# Patient Record
Sex: Female | Born: 1965 | Race: Black or African American | Hispanic: No | Marital: Single | Smoking: Never smoker
Health system: Southern US, Community
[De-identification: ages and names within clinical notes are randomized; demographics above are authoritative.]

## PROBLEM LIST (undated history)

## (undated) DIAGNOSIS — F419 Anxiety disorder, unspecified: Secondary | ICD-10-CM

## (undated) DIAGNOSIS — D649 Anemia, unspecified: Secondary | ICD-10-CM

## (undated) DIAGNOSIS — J45909 Unspecified asthma, uncomplicated: Secondary | ICD-10-CM

## (undated) DIAGNOSIS — Z9289 Personal history of other medical treatment: Secondary | ICD-10-CM

## (undated) HISTORY — PX: TUBAL LIGATION: SHX77

## (undated) HISTORY — PX: OTHER SURGICAL HISTORY: SHX169

---

## 1997-04-20 ENCOUNTER — Ambulatory Visit (HOSPITAL_COMMUNITY): Admission: RE | Admit: 1997-04-20 | Discharge: 1997-04-20 | Payer: Self-pay | Admitting: Internal Medicine

## 1997-04-26 ENCOUNTER — Other Ambulatory Visit: Admission: RE | Admit: 1997-04-26 | Discharge: 1997-04-26 | Payer: Self-pay | Admitting: Obstetrics & Gynecology

## 1997-06-13 ENCOUNTER — Ambulatory Visit: Admission: RE | Admit: 1997-06-13 | Discharge: 1997-06-13 | Payer: Self-pay | Admitting: Internal Medicine

## 1997-06-21 ENCOUNTER — Emergency Department (HOSPITAL_COMMUNITY): Admission: EM | Admit: 1997-06-21 | Discharge: 1997-06-21 | Payer: Self-pay | Admitting: Emergency Medicine

## 1997-07-26 ENCOUNTER — Ambulatory Visit (HOSPITAL_BASED_OUTPATIENT_CLINIC_OR_DEPARTMENT_OTHER): Admission: RE | Admit: 1997-07-26 | Discharge: 1997-07-26 | Payer: Self-pay | Admitting: Orthopedic Surgery

## 1997-08-08 ENCOUNTER — Encounter: Admission: RE | Admit: 1997-08-08 | Discharge: 1997-11-06 | Payer: Self-pay | Admitting: Orthopedic Surgery

## 1998-06-06 ENCOUNTER — Emergency Department (HOSPITAL_COMMUNITY): Admission: EM | Admit: 1998-06-06 | Discharge: 1998-06-06 | Payer: Self-pay | Admitting: Emergency Medicine

## 1999-04-06 ENCOUNTER — Ambulatory Visit (HOSPITAL_COMMUNITY): Admission: RE | Admit: 1999-04-06 | Discharge: 1999-04-06 | Payer: Self-pay | Admitting: Internal Medicine

## 1999-04-06 ENCOUNTER — Encounter: Admission: RE | Admit: 1999-04-06 | Discharge: 1999-04-06 | Payer: Self-pay | Admitting: Internal Medicine

## 1999-04-06 ENCOUNTER — Encounter: Payer: Self-pay | Admitting: Internal Medicine

## 1999-07-16 ENCOUNTER — Ambulatory Visit (HOSPITAL_COMMUNITY): Admission: RE | Admit: 1999-07-16 | Discharge: 1999-07-16 | Payer: Self-pay | Admitting: Gastroenterology

## 1999-07-16 ENCOUNTER — Encounter: Payer: Self-pay | Admitting: Gastroenterology

## 1999-09-25 ENCOUNTER — Other Ambulatory Visit: Admission: RE | Admit: 1999-09-25 | Discharge: 1999-09-25 | Payer: Self-pay | Admitting: Obstetrics and Gynecology

## 2004-12-29 ENCOUNTER — Emergency Department (HOSPITAL_COMMUNITY): Admission: EM | Admit: 2004-12-29 | Discharge: 2004-12-29 | Payer: Self-pay | Admitting: Emergency Medicine

## 2007-12-29 ENCOUNTER — Emergency Department (HOSPITAL_COMMUNITY): Admission: EM | Admit: 2007-12-29 | Discharge: 2007-12-29 | Payer: Self-pay | Admitting: Family Medicine

## 2008-06-29 ENCOUNTER — Emergency Department (HOSPITAL_COMMUNITY): Admission: EM | Admit: 2008-06-29 | Discharge: 2008-06-29 | Payer: Self-pay | Admitting: Emergency Medicine

## 2008-10-19 ENCOUNTER — Encounter: Payer: Self-pay | Admitting: Physician Assistant

## 2009-01-09 ENCOUNTER — Ambulatory Visit: Payer: Self-pay | Admitting: Family Medicine

## 2009-01-09 ENCOUNTER — Encounter: Payer: Self-pay | Admitting: Physician Assistant

## 2009-01-09 DIAGNOSIS — D509 Iron deficiency anemia, unspecified: Secondary | ICD-10-CM | POA: Insufficient documentation

## 2009-01-09 DIAGNOSIS — R143 Flatulence: Secondary | ICD-10-CM

## 2009-01-09 DIAGNOSIS — R142 Eructation: Secondary | ICD-10-CM

## 2009-01-09 DIAGNOSIS — J45909 Unspecified asthma, uncomplicated: Secondary | ICD-10-CM | POA: Insufficient documentation

## 2009-01-09 DIAGNOSIS — N920 Excessive and frequent menstruation with regular cycle: Secondary | ICD-10-CM

## 2009-01-09 DIAGNOSIS — R141 Gas pain: Secondary | ICD-10-CM | POA: Insufficient documentation

## 2009-01-09 DIAGNOSIS — R002 Palpitations: Secondary | ICD-10-CM | POA: Insufficient documentation

## 2009-01-09 LAB — CONVERTED CEMR LAB
ALT: 10 units/L (ref 0–35)
AST: 14 units/L (ref 0–37)
Albumin: 4.3 g/dL (ref 3.5–5.2)
Alkaline Phosphatase: 64 units/L (ref 39–117)
BUN: 6 mg/dL (ref 6–23)
CO2: 23 meq/L (ref 19–32)
Calcium: 9.2 mg/dL (ref 8.4–10.5)
Chloride: 103 meq/L (ref 96–112)
Cholesterol: 164 mg/dL (ref 0–200)
Creatinine, Ser: 0.74 mg/dL (ref 0.40–1.20)
Ferritin: 10 ng/mL (ref 10–291)
Glucose, Bld: 80 mg/dL (ref 70–99)
HCT: 35.5 % — ABNORMAL LOW (ref 36.0–46.0)
HDL: 60 mg/dL (ref >39)
Helicobacter Pylori Antibody-IgG: <0.4
Hemoglobin: 11.8 g/dL — ABNORMAL LOW (ref 12.0–15.0)
Hgb A1c MFr Bld: 5.8 % (ref 4.6–6.1)
Iron: 20 ug/dL — ABNORMAL LOW (ref 42–145)
LDL Cholesterol: 88 mg/dL (ref 0–99)
MCHC: 33.2 g/dL (ref 30.0–36.0)
MCV: 78 fL (ref 78.0–100.0)
Platelets: 460 10*3/uL — ABNORMAL HIGH (ref 150–400)
Potassium: 4.6 meq/L (ref 3.5–5.3)
RBC: 4.55 M/uL (ref 3.87–5.11)
RDW: 13.8 % (ref 11.5–15.5)
Sodium: 140 meq/L (ref 135–145)
TSH: 1.191 microintl units/mL (ref 0.350–4.500)
Total Bilirubin: 0.3 mg/dL (ref 0.3–1.2)
Total CHOL/HDL Ratio: 2.7
Total Protein: 7.4 g/dL (ref 6.0–8.3)
Triglycerides: 80 mg/dL (ref <150)
VLDL: 16 mg/dL (ref 0–40)
Vit D, 25-Hydroxy: 11 ng/mL — ABNORMAL LOW (ref 30–89)
WBC: 9 10*3/uL (ref 4.0–10.5)

## 2009-01-10 ENCOUNTER — Encounter (INDEPENDENT_AMBULATORY_CARE_PROVIDER_SITE_OTHER): Payer: Self-pay | Admitting: Family Medicine

## 2009-01-11 ENCOUNTER — Ambulatory Visit: Payer: Self-pay | Admitting: Family Medicine

## 2009-01-12 ENCOUNTER — Encounter (INDEPENDENT_AMBULATORY_CARE_PROVIDER_SITE_OTHER): Payer: Self-pay | Admitting: Family Medicine

## 2009-01-12 LAB — CONVERTED CEMR LAB
Collection Interval-CRCL: 24 hr
Creatinine 24 HR UR: 1887 mg/24hr — ABNORMAL HIGH (ref 700–1800)
Creatinine, Urine: 188.7 mg/dL
Protein, Ur: 30 mg/24hr — ABNORMAL LOW (ref 50–100)

## 2009-01-16 ENCOUNTER — Encounter: Payer: Self-pay | Admitting: Physician Assistant

## 2009-01-16 ENCOUNTER — Ambulatory Visit (HOSPITAL_COMMUNITY): Admission: RE | Admit: 2009-01-16 | Discharge: 2009-01-16 | Payer: Self-pay | Admitting: Family Medicine

## 2009-01-16 ENCOUNTER — Encounter (INDEPENDENT_AMBULATORY_CARE_PROVIDER_SITE_OTHER): Payer: Self-pay | Admitting: Family Medicine

## 2009-01-16 DIAGNOSIS — D259 Leiomyoma of uterus, unspecified: Secondary | ICD-10-CM | POA: Insufficient documentation

## 2009-02-09 ENCOUNTER — Ambulatory Visit: Payer: Self-pay | Admitting: Physician Assistant

## 2009-02-09 DIAGNOSIS — K047 Periapical abscess without sinus: Secondary | ICD-10-CM | POA: Insufficient documentation

## 2009-02-09 DIAGNOSIS — E559 Vitamin D deficiency, unspecified: Secondary | ICD-10-CM | POA: Insufficient documentation

## 2009-02-16 ENCOUNTER — Ambulatory Visit: Payer: Self-pay | Admitting: Physician Assistant

## 2009-02-17 ENCOUNTER — Encounter: Payer: Self-pay | Admitting: Physician Assistant

## 2009-02-17 LAB — CONVERTED CEMR LAB: Metanephrines, Ur: 100 (ref 58–203)

## 2009-02-20 ENCOUNTER — Encounter: Payer: Self-pay | Admitting: Physician Assistant

## 2009-02-21 ENCOUNTER — Encounter: Payer: Self-pay | Admitting: Physician Assistant

## 2009-03-09 ENCOUNTER — Telehealth: Payer: Self-pay | Admitting: Physician Assistant

## 2009-03-10 ENCOUNTER — Ambulatory Visit: Payer: Self-pay | Admitting: Obstetrics and Gynecology

## 2009-03-10 ENCOUNTER — Other Ambulatory Visit: Admission: RE | Admit: 2009-03-10 | Discharge: 2009-03-10 | Payer: Self-pay | Admitting: Obstetrics and Gynecology

## 2009-03-28 ENCOUNTER — Ambulatory Visit: Payer: Self-pay | Admitting: Physician Assistant

## 2009-03-28 DIAGNOSIS — Z8719 Personal history of other diseases of the digestive system: Secondary | ICD-10-CM

## 2009-03-28 DIAGNOSIS — K5649 Other impaction of intestine: Secondary | ICD-10-CM

## 2009-03-28 LAB — CONVERTED CEMR LAB: OCCULT 1: NEGATIVE

## 2009-03-30 ENCOUNTER — Encounter: Payer: Self-pay | Admitting: Physician Assistant

## 2009-03-30 LAB — CONVERTED CEMR LAB
BUN: 9 mg/dL (ref 6–23)
CO2: 22 meq/L (ref 19–32)
Chloride: 106 meq/L (ref 96–112)
Creatinine, Ser: 0.74 mg/dL (ref 0.40–1.20)
Eosinophils Absolute: 0.8 10*3/uL — ABNORMAL HIGH (ref 0.0–0.7)
Eosinophils Relative: 8 % — ABNORMAL HIGH (ref 0–5)
Glucose, Bld: 82 mg/dL (ref 70–99)
HCT: 35 % — ABNORMAL LOW (ref 36.0–46.0)
Hemoglobin: 9.9 g/dL — ABNORMAL LOW (ref 12.0–15.0)
Lymphocytes Relative: 24 % (ref 12–46)
Lymphs Abs: 2.4 10*3/uL (ref 0.7–4.0)
MCV: 74.9 fL — ABNORMAL LOW (ref 78.0–100.0)
Monocytes Absolute: 1 10*3/uL (ref 0.1–1.0)
Platelets: 443 10*3/uL — ABNORMAL HIGH (ref 150–400)
Potassium: 5 meq/L (ref 3.5–5.3)
WBC: 10.4 10*3/uL (ref 4.0–10.5)

## 2009-03-31 ENCOUNTER — Encounter: Payer: Self-pay | Admitting: Physician Assistant

## 2009-03-31 ENCOUNTER — Ambulatory Visit: Payer: Self-pay | Admitting: Obstetrics and Gynecology

## 2009-04-03 ENCOUNTER — Ambulatory Visit: Payer: Self-pay | Admitting: Physician Assistant

## 2009-04-03 DIAGNOSIS — E538 Deficiency of other specified B group vitamins: Secondary | ICD-10-CM | POA: Insufficient documentation

## 2009-04-04 LAB — CONVERTED CEMR LAB
Basophils Absolute: 0.1 10*3/uL (ref 0.0–0.1)
Basophils Relative: 1 % (ref 0–1)
Eosinophils Absolute: 0.7 10*3/uL (ref 0.0–0.7)
MCHC: 27.9 g/dL — ABNORMAL LOW (ref 30.0–36.0)
MCV: 73.5 fL — ABNORMAL LOW (ref 78.0–100.0)
Monocytes Relative: 8 % (ref 3–12)
Neutro Abs: 5.9 10*3/uL (ref 1.7–7.7)
Neutrophils Relative %: 59 % (ref 43–77)
Platelets: 448 10*3/uL — ABNORMAL HIGH (ref 150–400)
RDW: 16.2 % — ABNORMAL HIGH (ref 11.5–15.5)

## 2009-04-07 ENCOUNTER — Encounter: Payer: Self-pay | Admitting: Physician Assistant

## 2009-04-10 ENCOUNTER — Ambulatory Visit: Payer: Self-pay | Admitting: Physician Assistant

## 2009-04-14 ENCOUNTER — Ambulatory Visit: Payer: Self-pay | Admitting: Physician Assistant

## 2009-04-18 ENCOUNTER — Telehealth: Payer: Self-pay | Admitting: Physician Assistant

## 2009-04-27 ENCOUNTER — Encounter (INDEPENDENT_AMBULATORY_CARE_PROVIDER_SITE_OTHER): Payer: Self-pay | Admitting: Cardiology

## 2009-04-27 ENCOUNTER — Ambulatory Visit (HOSPITAL_COMMUNITY): Admission: RE | Admit: 2009-04-27 | Discharge: 2009-04-27 | Payer: Self-pay | Admitting: Cardiology

## 2009-05-01 ENCOUNTER — Ambulatory Visit: Payer: Self-pay | Admitting: Physician Assistant

## 2009-05-01 DIAGNOSIS — M25569 Pain in unspecified knee: Secondary | ICD-10-CM

## 2009-05-15 ENCOUNTER — Ambulatory Visit: Payer: Self-pay | Admitting: Physician Assistant

## 2009-05-23 ENCOUNTER — Ambulatory Visit (HOSPITAL_COMMUNITY): Admission: RE | Admit: 2009-05-23 | Discharge: 2009-05-23 | Payer: Self-pay | Admitting: Gastroenterology

## 2009-06-14 ENCOUNTER — Ambulatory Visit: Payer: Self-pay | Admitting: Physician Assistant

## 2009-07-20 ENCOUNTER — Ambulatory Visit: Payer: Self-pay | Admitting: Obstetrics & Gynecology

## 2009-07-24 ENCOUNTER — Ambulatory Visit: Payer: Self-pay | Admitting: Physician Assistant

## 2009-08-23 ENCOUNTER — Ambulatory Visit: Payer: Self-pay | Admitting: Physician Assistant

## 2009-08-23 LAB — CONVERTED CEMR LAB
Basophils Absolute: 0.1 10*3/uL (ref 0.0–0.1)
Basophils Relative: 1 % (ref 0–1)
Eosinophils Absolute: 0.8 10*3/uL — ABNORMAL HIGH (ref 0.0–0.7)
MCHC: 26.4 g/dL — ABNORMAL LOW (ref 30.0–36.0)
MCV: 62.8 fL — ABNORMAL LOW (ref 78.0–100.0)
Monocytes Relative: 9 % (ref 3–12)
Neutrophils Relative %: 62 % (ref 43–77)
RBC: 4.46 M/uL (ref 3.87–5.11)
RDW: 20.4 % — ABNORMAL HIGH (ref 11.5–15.5)

## 2009-08-24 ENCOUNTER — Telehealth: Payer: Self-pay | Admitting: Physician Assistant

## 2009-08-25 ENCOUNTER — Encounter: Payer: Self-pay | Admitting: Physician Assistant

## 2009-08-27 ENCOUNTER — Encounter: Payer: Self-pay | Admitting: Physician Assistant

## 2009-08-28 ENCOUNTER — Ambulatory Visit: Payer: Self-pay | Admitting: Physician Assistant

## 2009-08-28 LAB — CONVERTED CEMR LAB
Basophils Absolute: 0.1 10*3/uL (ref 0.0–0.1)
Basophils Relative: 1 % (ref 0–1)
Ferritin: 3 ng/mL — ABNORMAL LOW (ref 10–291)
MCHC: 26.9 g/dL — ABNORMAL LOW (ref 30.0–36.0)
Neutro Abs: 7.3 10*3/uL (ref 1.7–7.7)
Neutrophils Relative %: 58 % (ref 43–77)
RBC Folate: 746 ng/mL — ABNORMAL HIGH (ref 180–600)
RBC: 4.71 M/uL (ref 3.87–5.11)
RDW: 20.4 % — ABNORMAL HIGH (ref 11.5–15.5)

## 2009-09-05 ENCOUNTER — Encounter: Payer: Self-pay | Admitting: Physician Assistant

## 2009-09-08 ENCOUNTER — Telehealth: Payer: Self-pay | Admitting: Physician Assistant

## 2009-09-28 ENCOUNTER — Ambulatory Visit: Payer: Self-pay | Admitting: Physician Assistant

## 2009-09-29 LAB — CONVERTED CEMR LAB
Basophils Absolute: 0 10*3/uL (ref 0.0–0.1)
Lymphocytes Relative: 24 % (ref 12–46)
Neutro Abs: 6.6 10*3/uL (ref 1.7–7.7)
Neutrophils Relative %: 63 % (ref 43–77)
Platelets: 453 10*3/uL — ABNORMAL HIGH (ref 150–400)
RDW: 21.9 % — ABNORMAL HIGH (ref 11.5–15.5)

## 2009-10-30 ENCOUNTER — Ambulatory Visit: Payer: Self-pay | Admitting: Physician Assistant

## 2009-11-30 ENCOUNTER — Ambulatory Visit: Payer: Self-pay | Admitting: Internal Medicine

## 2009-12-01 ENCOUNTER — Ambulatory Visit: Payer: Self-pay | Admitting: Internal Medicine

## 2010-02-27 NOTE — Progress Notes (Signed)
Summary: GI referral update -  Appt 4/42011  Phone Note From Other Clinic   Caller: Referral Coordinator Summary of Call: Pt aware of appt on 05-01-09 @ 9:15am with Dr.Ganem @ [facility] Initial call taken by: Candi Leash,  April 18, 2009 10:57 AM

## 2010-02-27 NOTE — Letter (Signed)
Summary: *HSN Results Follow up  [facility]  [address] [place], [place] [address]   Phone: [phone]  Fax: [phone]      02/20/2009   [facility] [place] [address] [place], [place]  [address]   Dear  Ms. Haley Weber,                            ____S.Drinkard,FNP   ____D. Gore,FNP       ____B. McPherson,MD   ____V. Rankins,MD    ____E. Mulberry,MD    ____N. Daphine Deutscher, FNP  ____D. Reche Dixon, MD    ____K. Philipp Deputy, MD    __x__S. Alben Spittle, PA-C     This letter is to inform you that your recent test(s):  _______Pap Smear    _______Lab Test     _______X-ray    _______ is within acceptable limits  _______ requires a medication change  _______ requires a follow-up lab visit  _______ requires a follow-up visit with your provider   Comments: 24 hour urine test was completely normal.       _________________________________________________________ If you have any questions, please contact our office                     Sincerely,  Tereso Newcomer PA-C [facility]

## 2010-02-27 NOTE — Letter (Signed)
Summary: *Referral Letter  [facility]  [address] [place], [place] [address]   Phone: [phone]  Fax: [phone]    03/30/2009  Thank you in advance for agreeing to see my patient:  Haley Weber [address] [place], [place]  [address]  Phone: [phone]  Reason for Referral: 45 yo female with h/o menorrhagia and iron deficiency anemia for which she has seen gynecology.  However, she had palpitations recently and epigastric pain followed by "black" stool.  She describes the stool as "pudding."  Her recent Hgb is 9.9.  It was 11.8 in December.  Her MCV is low.  She had heme negative stool in the office.  BUN was normal on recent blood work.  Please evaluate.  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 4)  VENTOLIN HFA 108 (90 BASE) MCG/ACT AERS (ALBUTEROL SULFATE) 1-2 puffs every 4-6 hours as needed 5)  METOPROLOL TARTRATE 25 MG TABS (METOPROLOL TARTRATE) Take 1 by mouth as needed for palpitations.  Past Medical History: 1)  Anemia-iron deficiency 2)  Asthma  Pertinent Labs:  As above   Thank you again for agreeing to see our patient; please contact us if you have any further questions or need additional information.  Sincerely,  Tereso Newcomer PA-C

## 2010-02-27 NOTE — Progress Notes (Signed)
  Phone Note From Other Clinic   Summary of Call: Jasmine December, from the [facility] called in today because they need the provider notes, labs, last visit or anything regarding that states why she was referring  there.  It seems that the pt is bleeding a lot..  [phone] office [phone].  The pt suppostly needs to be there by tomorrow but she needs everything before tomorrow.  [employer]. Initial call taken by: Manon Hilding,  March 09, 2009 9:45 AM  Follow-up for Phone Call        spoke with rececption and pt appt was reschedule upon pt .Marland Kitchen... faxing notes today Follow-up by: Arm[place] Shannon,  March 13, 2009 9:34 AM

## 2010-02-27 NOTE — Assessment & Plan Note (Signed)
Summary: NEED APPROVAL FOR CRYO THERAPY//KT   Vital Signs:  Patient profile:   45 year old female Menstrual status:  regular LMP:     11/13/2009 Weight:      269.5 pounds BSA:     2.16 Temp:     98 degrees F oral Pulse rate:   80 / minute Pulse rhythm:   regular Resp:     18 per minute BP sitting:   130 / 80  (left arm) Cuff size:   large  Vitals Entered By: Gaylyn Cheers RN (December 01, 2009 3:04 PM) CC: needs referral for cryo for fibroid  GYN clinic Is Patient Diabetic? No Pain Assessment Patient in pain? no       Does patient need assistance? Functional Status Self care Ambulation Normal Comments Pt. takes only Iron, VitD, Calcium, Vit B-12, Ventolin no other medications LMP (date): 11/13/2009     Menstrual Status regular Enter LMP: 11/13/2009   Primary Care Provider:  Tereso Newcomer PA-C  CC:  needs referral for cryo for fibroid  GYN clinic.  History of Present Illness: 1.  Uterine fibroids:  [facility]--was told to have me call [phone] and ask to have cryosurgery for her fibroids rather than endometrial ablation so that she may have a better shot at maintaining fertility.  Called number--fax number.  Pt. states she has heavy bleeding with her periods--for which she was evaluated at [facility].  Later, able to find last progress note from [facility]:  was not offered crysosurgery--Pt. was vacillating between IUD and myomectomy, endometrial ablation.  She later cancelled surgery for the latter.  When discussed with pt, she states she was told by the receptionist at [facility] and NOT [facility] that she could get the cryosurgery if I sent a request to the clinic.  The [phone] number above is actually fax number for [facility]  Allergies (verified): No Known Drug Allergies  Physical Exam  General:  NAD   Impression & Recommendations:  Problem # 1:  FIBROIDS, UTERUS (ICD-218.9) Menorrhagia--will [facility] as they are closed for  the weekend and discuss. Discussed with pt. that her ability to get pregnant at her current age is likely limited and she needs to discuss with gyne the likelihood of pregnancy if she gets the cryosurgery.  Not clear that they actually offer this treatment based on note and my experience in referring to them.  Complete Medication List: 1)  Pepcid 20 Mg Tabs (Famotidine) .... Take 1 tablet by mouth two times a day 2)  Nu-iron 150 Mg Caps (Polysaccharide iron complex) .... Take 1 tablet by mouth two times a day 3)  Ergocalciferol 50000 Unit Caps (Ergocalciferol) .Marland Kitchen.. 1 by mouth every week for 12 weeks. 4)  Ventolin Hfa 108 (90 Base) Mcg/act Aers (Albuterol sulfate) .Marland Kitchen.. 1-2 puffs every 4-6 hours as needed 5)  Metoprolol Tartrate 25 Mg Tabs (Metoprolol tartrate) .... Take 1 by mouth as needed for palpitations.   Orders Added: 1)  Est. Patient Level III [86578]   Not Administered:    Influenza Vaccine not given due to: declined

## 2010-02-27 NOTE — Miscellaneous (Signed)
Summary: Records from prior PCP reviewed   Reviewed records from prior PCP. Note she used to get B12 injections from prior PCP. Ask her when she last received B12 injection.   Left message on answering machine for pt to call back...Marland KitchenMarland KitchenArmenia Shannon  April 04, 2009 9:08 AM   pt says she recieved her last shot last year with her previous provider around Sept. or Oct..Marland KitchenJaslin Novitski says her previous provider did send her bill and she thinks she has to pay this for [place] to get her records... pt says she did not owe them any money she thinks its the price to retrieve records... pt says she doesn't have this at the time but she wanted you aware .Marland Kitchen [place] Shannon  April 06, 2009 4:51 PM   Have her come in for labs: B12 and folate. Then start B12 injections q month. Make sure she is taking her iron. Tereso Newcomer, [place] 3.10.2011  15:08   Left message on answering machine for pt to call back...Marland KitchenMarland KitchenArmenia Shannon  April 07, 2009 4:25 PM  spoke with pt and she said she doesn't have the money to pick up meds and has not turned in information to pharmacy yet....  and she has appt to come in for labs.. [place] Shannon  April 07, 2009 4:56 PM    Clinical Lists Changes  Problems: Added new problem of B12 DEFICIENCY (ICD-266.2) - Signed Assessed B12 DEFICIENCY as comment only - start b12 injections check b12 and folate - Signed Observations: Added new observation of PAST MED HX: Anemia-iron deficiency Asthma Obesity Paresthesias Palpitations Fatigue Menorrhagia B12 deficiency Vitamin D Deficiency Pap 04/2008:  normal except + Trich Mammo 09/2008:  normal  (04/03/2009 20:35)       Impression & Recommendations:  Problem # 1:  B12 DEFICIENCY (ICD-266.2) start b12 injections check b12 and folate  Complete Medication List: 1)  Pepcid 20 Mg Tabs (Famotidine) .... Take 1 tablet by mouth two times a day 2)  Nu-iron 150 Mg Caps (Polysaccharide iron complex) .... Take 1 tablet by mouth two times  a day 3)  Ergocalciferol 50000 Unit Caps (Ergocalciferol) .Marland Kitchen.. 1 by mouth every week for 12 weeks. 4)  Ventolin Hfa 108 (90 Base) Mcg/act Aers (Albuterol sulfate) .Marland Kitchen.. 1-2 puffs every 4-6 hours as needed 5)  Metoprolol Tartrate 25 Mg Tabs (Metoprolol tartrate) .... Take 1 by mouth as needed for palpitations.   Past History:  Past Medical History: Anemia-iron deficiency Asthma Obesity Paresthesias Palpitations Fatigue Menorrhagia B12 deficiency Vitamin D Deficiency Pap 04/2008:  normal except + Trich Mammo 09/2008:  normal

## 2010-02-27 NOTE — Assessment & Plan Note (Signed)
Summary: 1 MONTH FU///KT   Vital Signs:  Patient profile:   45 year old female Height:      61.5 inches Weight:      262 pounds BMI:     48.88 O2 Sat:      96 % on Room air Temp:     97.9 degrees F oral Pulse rate:   88 / minute Pulse rhythm:   regular Resp:     18 per minute BP sitting:   124 / 85  (left arm) Cuff size:   regular  Vitals Entered By: [place] Shannon (February 09, 2009 8:29 AM)  O2 Flow:  Room air CC: pt says she still have problems when she goes to sleep...Marland Kitchen pt says her left is numb and her heart races as she trys to sleep...Marland Kitchen pt says it feels as if she has a helmet on and she is tired...Marland KitchenMarland Kitchen pt wants a referral to denist.... Is Patient Diabetic? No Pain Assessment Patient in pain? no       Does patient need assistance? Functional Status Self care Ambulation Normal Comments pf 1.   345     2.   405    3.  350   Primary Care Provider:  Carolyne Fiscal  CC:  pt says she still have problems when she goes to sleep...Marland Kitchen pt says her left is numb and her heart races as she trys to sleep...Marland Kitchen pt says it feels as if she has a helmet on and she is tired...Marland KitchenMarland Kitchen pt wants a referral to denist.....  History of Present Illness: Here for f/u. Originally saw Dr. Carolyne Fiscal one month ago.  Now following up with me. Had myriad of tests run for anemia, menorrhagia and palp's.  Palp's:  Pt eval at [facility] in 09/2008 for palp's.  First started in Sep.  Worse at night.  Describes tachypalp's.  Feels near syncopal and nauseated.  Wakes her up at times.  Says her Echo was normal at [facility].  Also, wore a monitor.  Sounds like a 24 hour holter.  Says she had "tachycardia."  Was scheduled to have a stress test.  Has chest discomfort with palp's.  Feels heavy/pressure like.  Feels short of breath with palp's.  No exertional chest pain or shortness of breath.  No syncope.  No orthopnea.  Slight edema in lower extremities.  Has feeling of tingling in arms with palps.  Occurs once or twice a week.  Not getting worse.   Seems to be getting better.  No caffeine, OTC cold meds, diet pills, drugs, alcohol, cigs.  Says she brought records with her last time and Echo demonstrated normal LVF; overall normal echo.  Labs ordered last time included TSH which was normal.  Urine to r/o pheo ordered but wrong test run (creat. clearance).  Anemia:  Patient has fibroid uterus.  Is iron deficient with just min. low Hgb.  GYN referral pending.  Abd bloating:  Never tried omeprazole.  Also c/o dental pain.  Has broken tooth on top left.  Pain worse over last few days.  No fever.  Face feels swollen.  Current Medications (verified): 1)  Omeprazole 20 Mg Cpdr (Omeprazole) .Marland Kitchen.. 1 By Mouth Daily For 4 Weeks 2)  Iron 325 (65 Fe) Mg Tabs (Ferrous Sulfate) .Marland Kitchen.. 1 By Mouth Two Times A Day, Not With Omeprazole  Allergies (verified): No Known Drug Allergies  Past History:  Past Medical History: Last updated: 01/09/2009 Anemia-iron deficiency Asthma  Family History: Reviewed history from 01/09/2009 and no changes  required. Father deceased at age 86 from prostate cancer Mother alive with schizzophrenia, alzheimers, HTN Siblings with obesity, HTN, hyperthyroidism (?), depression Children well  Social History: Reviewed history from 01/09/2009 and no changes required. 5 sisters, one brother 1 daughter 72 1 son 84 Occupation: Part time with postal service  Lives with her adult children Single Never Smoked Alcohol use-no Drug use-no Regular exercise-no  Physical Exam  General:  alert, well-developed, and well-nourished.   Head:  normocephalic and atraumatic.   Eyes:  pupils equal, pupils round, and pupils reactive to light.   Mouth:  teeth missing and gingival inflammation.   upper molar on left mostly missing with surrounding erythema Neck:  supple.   Lungs:  normal breath sounds, no crackles, and no wheezes.   Heart:  normal rate and regular rhythm.   Abdomen:  soft.   Neurologic:  alert & oriented X3 and  cranial nerves II-XII intact.   Psych:  normally interactive.     Impression & Recommendations:  Problem # 1:  PALPITATIONS, RECURRENT (ICD-785.1)  echo normal unknown holter results TSH normal patient denies stress as a cause does have flushing and diaph. and reports h/o BP going up; will go ahead and reorder 24 hour urine to r/o pheo. check ECG today refer to cardiology  Orders: T-Urine 24 Hr. Catecholamines [phone]) T-Urine 24hr. VMA/HVA ([id number]) T-Urine 24 Hr. Metanephrines [id number]) Cardiology Referral (Cardiology)  Problem # 2:  ANEMIA-IRON DEFICIENCY (ICD-280.9)  likely related to fibroids check stool cards change to nuiron to prevent constipation  Her updated medication list for this problem includes:    Nu-iron 150 Mg Caps (Polysaccharide iron complex) .Marland Kitchen... Take 1 tablet by mouth two times a day  Orders: Hemoccult Cards -3 specimans (take home) ([id number])  Problem # 3:  ASTHMA (ICD-493.90) mild symptoms  Problem # 4:  ABDOMINAL BLOATING (ICD-787.3) CMET normal last check H pylori neg patient never tried PPI encouraged her to try  Problem # 5:  ABSCESS, TOOTH (ICD-522.5)  refert to dental tx with antibiotics  Orders: Dental Referral (Dentist)  Problem # 6:  VITAMIN D DEFICIENCY (ICD-268.9) start replacement  Complete Medication List: 1)  Omeprazole 20 Mg Cpdr (Omeprazole) .Marland Kitchen.. 1 by mouth daily for 4 weeks 2)  Nu-iron 150 Mg Caps (Polysaccharide iron complex) .... Take 1 tablet by mouth two times a day 3)  Amoxicillin 500 Mg Caps (Amoxicillin) .... Take 1 tablet by mouth three times a day 4)  Ergocalciferol 50000 Unit Caps (Ergocalciferol) .Marland Kitchen.. 1 by mouth every week for 12 weeks.  Patient Instructions: 1)  Take 650 - 1000 mg of tylenol every 4-6 hours as needed for relief of pain or comfort of fever. Avoid taking more than 4000 mg in a 24 hour period( can cause liver damage in higher doses).   2)  OR 3)  Take 400-600 mg of  Ibuprofen (Advil, Motrin) with food every 4-6 hours as needed  for relief of pain or comfort of fever.  4)  Please schedule a follow-up appointment in 6  weeks with Rosamaria Donn for palpitations.  5)  Sign release to get records from cardiologist at [facility]. Prescriptions: ERGOCALCIFEROL 50000 UNIT CAPS (ERGOCALCIFEROL) 1 by mouth every week for 12 weeks.  #12 x 0   Entered and Authorized by:   Tereso Newcomer PA-C   Signed by:   Tereso Newcomer PA-C on 02/09/2009   Method used:   Print then Give to Patient   RxID:   [id number] AMOXICILLIN 500 MG CAPS (AMOXICILLIN) Take  1 tablet by mouth three times a day  #21 x 0   Entered and Authorized by:   Tereso Newcomer PA-C   Signed by:   Tereso Newcomer PA-C on 02/09/2009   Method used:   Print then Give to Patient   RxID:   [id number] OMEPRAZOLE 20 MG CPDR (OMEPRAZOLE) 1 by mouth daily for 4 weeks  #30 x 1   Entered and Authorized by:   Tereso Newcomer PA-C   Signed by:   Tereso Newcomer PA-C on 02/09/2009   Method used:   Reprint   RxID:   [id number] NU-IRON 150 MG CAPS (POLYSACCHARIDE IRON COMPLEX) Take 1 tablet by mouth two times a day  #60 x 6   Entered and Authorized by:   Tereso Newcomer PA-C   Signed by:   Tereso Newcomer PA-C on 02/09/2009   Method used:   Print then Give to Patient   RxID:   [id number]    Pelvic US  Procedure date:  01/16/2009  Findings:      IMPRESSION:    1.  Approximate 2.5 cm submucosal fibroid in the uterine fundus   causing mass effect upon the endometrial stripe.   2.  No endometrial fluid or mass.   3.  Normal-appearing left ovary.  Right ovary not visualized.  EKG  Procedure date:  02/09/2009  Findings:      NSR HR 73 Normal axis no ischemic changes no preexcitation no Brugada like changes    Appended Document: 1 MONTH FU///KT  Laboratory Results    Stool - Occult Blood Hemmoccult #1: negative Date: 02/17/2009 Hemoccult #2: negative Date: 02/17/2009 Hemoccult #3: negative Date:  02/17/2009

## 2010-02-27 NOTE — Letter (Signed)
Summary: EGD &COLO REPORT & CONSULT NOTES  EGD &COLO REPORT & CONSULT NOTES   Imported By: Arta Bruce 09/28/2009 15:25:55  _____________________________________________________________________  External Attachment:    Type:   Image     Comment:   External Document

## 2010-02-27 NOTE — Progress Notes (Signed)
Summary: anemia  Phone Note Outgoing Call   Summary of Call: She is still very anemic. I reviewed her GI tests. She had a normal EGD and colo.  Make sure she takes the iron. Drink OJ with each dose.  She needs f/u with the Gyn clinic. Please fax her labs to the Gyn clinic. Please talk to the patient and notify the Gyn clinic that I want her to f/u there b/c her anemia is caused by her heavy periods. Please ask for her Chella Chapdelaine at the gyn clinic to call me on my cell phone next week ([phone]).  Repeat a CBC in 2 weeks.  Initial call taken by: Brynda Rim,  September 08, 2009 5:16 PM  Follow-up for Phone Call        Left message on answering machine for pt to call back.Marland KitchenMarland KitchenMarland KitchenArmenia Weber  September 11, 2009 2:37 PM   Left message on answering machine for pt to call back.Marland KitchenMarland KitchenMarland KitchenArmenia Weber  September 12, 2009 12:18 PM   Additional Follow-up for Phone Call Additional follow up Details #1::        Patient is aware of test results. Will follow-up with Gyn.Marland KitchenMarland KitchenMarland KitchenMarland Kitchenpatient lab visit after she has made appt. with Gyn.... Haley Weber  September 12, 2009 2:55 PM

## 2010-02-27 NOTE — Letter (Signed)
Summary: PT INFORMATION SHEET  PT INFORMATION SHEET   Imported By: Arta Bruce 02/24/2009 10:12:52  _____________________________________________________________________  External Attachment:    Type:   Image     Comment:   External Document

## 2010-02-27 NOTE — Assessment & Plan Note (Signed)
Summary: f/u with Scott in 1 month/REPEAT CBC//gk   Vital Signs:  Patient profile:   45 year old female Weight:      267 pounds O2 Sat:      99 % on Room air Temp:     98.5 degrees F Pulse rate:   83 / minute Pulse rhythm:   regular Resp:     18 per minute BP sitting:   119 / 80  (left arm) Cuff size:   large  Vitals Entered By: Chauncy Passy, SMA  O2 Flow:  Room air CC: Pt. is here b/c her left leg is swollen and tender. This has been going on since Saturday. Pt. currently has the holter monitor.  Is Patient Diabetic? No Pain Assessment Patient in pain? no       Does patient need assistance? Functional Status Self care Ambulation Normal Comments PF: [phone]   Primary Care Provider:  Tereso Newcomer PA-C  CC:  Pt. is here b/c her left leg is swollen and tender. This has been going on since Saturday. Pt. currently has the holter monitor. Marland Kitchen  History of Present Illness: Scheduled for f/u today. She saw card.  She had stress test.  BP went up  during stress.  No records yet.  She is wearing heart monitor.  Seeing Dr. Armanda Magic.  Saw GI.  Gets colo in couple weeks.  She is waiting to hear back if she can get the novasure ablation or IUD for her menorrhagia.  She developed some knee pain and swelling on the left 2 days after her ETT.  She notes it is better now.  Felt hot.  NO recent trips.  No injury to leg.  No syncope or chest pain.  Anemia:  Got B12 injection today.  Never got iron rx filled.   Current Medications (verified): 1)  Pepcid 20 Mg Tabs (Famotidine) .... Take 1 Tablet By Mouth Two Times A Day 2)  Nu-Iron 150 Mg Caps (Polysaccharide Iron Complex) .... Take 1 Tablet By Mouth Two Times A Day 3)  Ergocalciferol 50000 Unit Caps (Ergocalciferol) .Marland Kitchen.. 1 By Mouth Every Week For 12 Weeks. 4)  Ventolin Hfa 108 (90 Base) Mcg/act Aers (Albuterol Sulfate) .Marland Kitchen.. 1-2 Puffs Every 4-6 Hours As Needed 5)  Metoprolol Tartrate 25 Mg Tabs (Metoprolol Tartrate) ....  Take 1 By Mouth As Needed For Palpitations.  Allergies (verified): No Known Drug Allergies  Past History:  Past Medical History: Last updated: 04/03/2009 Anemia-iron deficiency Asthma Obesity Paresthesias Palpitations Fatigue Menorrhagia B12 deficiency Vitamin D Deficiency Pap 04/2008:  normal except + Trich Mammo 09/2008:  normal  Physical Exam  General:  alert, well-developed, and well-nourished.   Head:  normocephalic and atraumatic.   Neck:  supple.   Lungs:  normal breath sounds.   Heart:  normal rate and regular rhythm.   Msk:  left knee: no eff full ROM neg lachmans neg McMurray neg Appleys neg ant drawer + tend over insertion of medial hamstring tendon Extremities:  no edema Neurologic:  alert & oriented X3 and cranial nerves II-XII intact.   Psych:  normally interactive.     Impression & Recommendations:  Problem # 1:  KNEE PAIN, LEFT (ICD-719.46) suspect hamstring tendonitis nsaids ice rest elevation f/u as needed  Problem # 2:  B12 DEFICIENCY (ICD-266.2) start injections  Problem # 3:  MELENA, HX OF (ICD-V12.79) seeing GI  Problem # 4:  VITAMIN D DEFICIENCY (ICD-268.9) needs to start Rx  Problem #  5:  MENORRHAGIA (ICD-626.2) f/u with GYN clinic  Problem # 6:  PALPITATIONS, RECURRENT (ICD-785.1) f/u with Card  Her updated medication list for this problem includes:    Metoprolol Tartrate 25 Mg Tabs (Metoprolol tartrate) .Marland Kitchen... Take 1 by mouth as needed for palpitations.  Problem # 7:  ASTHMA (ICD-493.90) stable on rescue inhaler  Her updated medication list for this problem includes:    Ventolin Hfa 108 (90 Base) Mcg/act Aers (Albuterol sulfate) .Marland Kitchen... 1-2 puffs every 4-6 hours as needed  Problem # 8:  ANEMIA-IRON DEFICIENCY (ICD-280.9) needs to get Iron rx check labs in 3 mos  Her updated medication list for this problem includes:    Nu-iron 150 Mg Caps (Polysaccharide iron complex) .Marland Kitchen... Take 1 tablet by mouth two times a  day  Problem # 9:  PREVENTIVE HEALTH CARE (ICD-V70.0) schedule CPP  Complete Medication List: 1)  Pepcid 20 Mg Tabs (Famotidine) .... Take 1 tablet by mouth two times a day 2)  Nu-iron 150 Mg Caps (Polysaccharide iron complex) .... Take 1 tablet by mouth two times a day 3)  Ergocalciferol 50000 Unit Caps (Ergocalciferol) .Marland Kitchen.. 1 by mouth every week for 12 weeks. 4)  Ventolin Hfa 108 (90 Base) Mcg/act Aers (Albuterol sulfate) .Marland Kitchen.. 1-2 puffs every 4-6 hours as needed 5)  Metoprolol Tartrate 25 Mg Tabs (Metoprolol tartrate) .... Take 1 by mouth as needed for palpitations.  Patient Instructions: 1)  Try to get the iron and start as soon as you can. 2)  Schedule lab visit in 3 months (CBC) Dx 280.9, 266.2. 3)  Schedule B12 injection every month. 4)  Ice your knee two times a day for a couple days. 5)  Use ibuprofen for a couple days. 6)  Take 400-600 mg of Ibuprofen (Advil, Motrin) with food every 4-6 hours as needed  for relief of pain or comfort of fever.  7)  Keep left leg elevated as much as possible. 8)  Return if no better or sooner if worse. 9)  Schedule CPP with Lorin Picket in September 2011.  Come fasting for labs (nothing to eat or drink after midnight except water).

## 2010-02-27 NOTE — Miscellaneous (Signed)
  Clinical Lists Changes  Problems: Changed problem from PALPITATIONS, RECURRENT (ICD-785.1) to PALPITATIONS, RECURRENT (ICD-785.1) - a.  event monitor/holter:  NSR, ST, PVC, PAC   b.  ETT: "ok" per card notes   c.  Echo: "ok" per card. notes   d.  Dr. Armanda Magic [facility] Card. Observations: Added new observation of PAST MED HX: Anemia-iron deficiency Asthma Obesity Paresthesias Palpitations   a.  event monitor/holter:  NSR, ST, PVC, PAC 3.2011   b.  ETT: "ok" per card notes   c.  Echo: "ok" per card. notes   d.  Dr. Armanda Magic [facility] Card. Fatigue Menorrhagia B12 deficiency Vitamin D Deficiency Pap 04/2008:  normal except + Trich Mammo 09/2008:  normal  (08/27/2009 13:10)       Past History:  Past Medical History: Anemia-iron deficiency Asthma Obesity Paresthesias Palpitations   a.  event monitor/holter:  NSR, ST, PVC, PAC 3.2011   b.  ETT: "ok" per card notes   c.  Echo: "ok" per card. notes   d.  Dr. Armanda Magic [facility] Card. Fatigue Menorrhagia B12 deficiency Vitamin D Deficiency Pap 04/2008:  normal except + Trich Mammo 09/2008:  normal

## 2010-02-27 NOTE — Letter (Signed)
Summary: REQUESTING RECORDS FROM [facility]  REQUESTING RECORDS FROM [facility]   Imported By: Arta Bruce 03/06/2009 16:06:35  _____________________________________________________________________  External Attachment:    Type:   Image     Comment:   External Document

## 2010-02-27 NOTE — Letter (Signed)
Summary: SLIDING SCALE FEE  SLIDING SCALE FEE   Imported By: Arta Bruce 02/24/2009 10:15:20  _____________________________________________________________________  External Attachment:    Type:   Image     Comment:   External Document

## 2010-02-27 NOTE — Letter (Signed)
Summary: [facility]  echo from [facility]   Imported By: Arta Bruce 04/03/2009 12:30:41  _____________________________________________________________________  External Attachment:    Type:   Image     Comment:   External Document

## 2010-02-27 NOTE — Progress Notes (Signed)
Summary: Anemia  Phone Note Outgoing Call   Summary of Call: Patient hemoglobin much lower now.   I left her a message to call me back ASAP. I need to speak with her when she gets on phone.  Very important.  She needs to do stool cards x 3.  Initial call taken by: Brynda Rim,  August 24, 2009 9:04 AM  Follow-up for Phone Call        tried calling pt but no answer .Marland KitchenMarland KitchenMarland KitchenArmenia Shannon  August 24, 2009 9:41 AM  Pt called back medical assistant was busy so please call her back .Manon Hilding  August 24, 2009 10:39 AM  Additional Follow-up for Phone Call Additional follow up Details #1::        Spoke to patient. She is mildy symptomatic (notes lightheadedness with standing) and fatigue. No palps or syncope. Suggested we transfuse her with PRBCs and she wants to hold off. She states she is taking her iron every day. She has been getting B12 shots every month. She has not f/u with GYN yet. Periods are heavier now and lasting 8-10 days with very heavy bleeding each day. She denies melena or hematochezia or hematemsis.  [place], Bring her in for repeat labs tomorrow or Monday. Labs:  CBC, Ferritin, RBC Folate, B12,  She needs to complete stool cards x3 as soon as possible. Check orthostatic BP and HR when she comes in. I forgot to tell her on the phone . . . please tell her to take her iron with a glass of orange juice or take Vitamin C 500 mg (with each dose of Iron).  This may help absorption.  Please request records from [facility]).  Additional Follow-up by: Tereso Newcomer PA-C,  August 24, 2009 3:12 PM    Additional Follow-up for Phone Call Additional follow up Details #2::    pt is aware of appt....   request records from GI..... requested records from card.... [place] Shannon  August 25, 2009 10:17 AM     EGD  Procedure date:  05/23/2009  Findings:      IMPRESSION:  Normal esophagogastroduodenoscopy.      There was nothing obvious on this exam to  explain anemia.      The patient will follow up with her physician at [facility]. Location: [facility]   Done by Dr. Herbert Moors with [facility].   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 . .transfuse with PRBCs will verify if she is taking iron check stool cards . . .if + get her in for colo   WBC                  [H]  11.8 K/uL                   4.0-10.5   RBC                       4.46 MIL/uL                 3.87-5.11   Hemoglobin           [  L]  7.4 g/dL                    11.9-14.7   Hematocrit           [L]  28.0 %                      36.0-46.0   MCV                  [L]  62.8 fL                     78.0-100.0 ! MCH                       16.6 pg   MCHC                 [L]  26.4 g/dL                   82.9-56.2   RDW                  [H]  20.4 %                      11.5-15.5   Platelet Count       [H]  503 K/uL                    150-400   Granulocyte %             62 %                        43-77   Absolute Gran             7.3 K/uL                    1.7-7.7   Lymph %                   22 %                        12-46   Absolute Lymph            2.6 K/uL                    0.7-4.0   Mono %                    9 %                         3-12   Absolute Mono        [H]  1.1 K/uL                    0.1-1.0   Eos %                [H]  6 %                         0-5   Absolute Eos         [H]  0.8 K/uL                    0.0-0.7   Baso %  1 %                         0-1   Absolute Baso             0.1 K/uL                    0.0-0.1   Smear Review       RESULT: Criteria for review not met  Note: An exclamation mark (!)  indicates a result that was not dispersed into the flowsheet. Document Creation Date: 08/24/2009 12:44 AM   Her updated medication list for this problem includes:    Nu-iron 150 Mg Caps (Polysaccharide iron complex) .Marland Kitchen... 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 She is mildy symptomatic (notes lightheadedness with standing) and fatigue. No palps or syncope. Suggested we transfuse her with PRBCs and she wants to hold off. She states she is taking her iron every day. She has been getting B12 shots every month. She has not f/u with GYN yet. Periods are heavier now and lasting 8-10 days with very heavy bleeding each day. She denies melena or hematochezia or hematemsis.  If stool cards +, will send her to Dr. Doreatha Martin for colo. If she is orthostatic, will try to get her to have txn with PRBCs.    Her updated medication list for this problem includes:    Nu-iron 150 Mg Caps (Polysaccharide iron complex) .Marland Kitchen... Take 1 tablet by mouth two times a day  Complete Medication List: 1)  Pepcid 20 Mg Tabs (Famotidine) .... Take 1 tablet by mouth two times a day 2)  Nu-iron 150 Mg Caps (Polysaccharide iron complex) .... Take 1 tablet by mouth two times a day 3)  Ergocalciferol 50000 Unit Caps (Ergocalciferol) .Marland Kitchen.. 1 by mouth every week for 12 weeks. 4)  Ventolin Hfa 108 (90 Base) Mcg/act Aers (Albuterol sulfate) .Marland Kitchen.. 1-2 puffs every 4-6 hours as needed 5)  Metoprolol Tartrate 25 Mg Tabs (Metoprolol tartrate) .... Take 1 by mouth as needed for palpitations.    Appended Document: Anemia     Allergies: No Known Drug Allergies   Complete Medication List: 1)  Pepcid 20 Mg Tabs (Famotidine) .... Take 1 tablet by mouth two times a day 2)  Nu-iron 150 Mg Caps (Polysaccharide iron complex) .... Take 1 tablet by mouth two times a day 3)  Ergocalciferol 50000 Unit Caps (Ergocalciferol) .Marland Kitchen.. 1 by mouth every week for 12 weeks. 4)   Ventolin Hfa 108 (90 Base) Mcg/act Aers (Albuterol sulfate) .Marland Kitchen.. 1-2 puffs every 4-6 hours as needed 5)  Metoprolol Tartrate 25 Mg Tabs (Metoprolol tartrate) .... Take 1 by mouth as needed for palpitations.   Laboratory Results    Stool - Occult Blood Hemmoccult #1: negative Date: 09/05/2009 Hemoccult #2: negative Date: 09/05/2009 Hemoccult #3: negative Date: 09/05/2009

## 2010-02-27 NOTE — Letter (Signed)
Summary: *HSN Results Follow up  [facility]  [address] [place], [place] [address]   Phone: [phone]  Fax: [phone]      09/05/2009   [facility] [address] [place], [place]  [address]   Dear  Ms. Haley Weber,                            ____S.Drinkard,FNP   ____D. Gore,FNP       ____B. McPherson,MD   ____V. Rankins,MD    ____E. Mulberry,MD    ____N. Daphine Deutscher, FNP  ____D. Reche Dixon, MD    ____K. Philipp Deputy, MD    __x__S. Alben Spittle, PA-C     This letter is to inform you that your recent test(s):  _______Pap Smear    ___x____Lab Test     _______X-ray    ___x__ is within acceptable limits  _______ requires a medication change  _______ requires a follow-up lab visit  _______ requires a follow-up visit with your provider   Comments:  Stool test negative for blood.       _________________________________________________________ If you have any questions, please contact our office                     Sincerely,  Tereso Newcomer PA-C [facility]

## 2010-02-27 NOTE — Letter (Signed)
Summary: *HSN Results Follow up  [facility]  [address] [place], [place] [address]   Phone: [phone]  Fax: [phone]      02/20/2009   [facility] [place] [address] [place], [place]  [address]   Dear  Ms. Shamonica Cale,                            ____S.Drinkard,FNP   ____D. Gore,FNP       ____B. McPherson,MD   ____V. Rankins,MD    ____E. Mulberry,MD    ____N. Daphine Deutscher, FNP  ____D. Reche Dixon, MD    ____K. Philipp Deputy, MD    __x__S. Alben Spittle, PA-C     This letter is to inform you that your recent test(s):  _______Pap Smear    ___x____Lab Test     _______X-ray    __x_____ is within acceptable limits  _______ requires a medication change  _______ requires a follow-up lab visit  _______ requires a follow-up visit with your provider   Comments: Stool cards were negative for blood.       _________________________________________________________ If you have any questions, please contact our office                     Sincerely,  Tereso Newcomer PA-C [facility]

## 2010-02-27 NOTE — Letter (Signed)
Summary: [facility]//CARDIOLOGY NOTES  [facility]//CARDIOLOGY NOTES   Imported By: Arta Bruce 09/28/2009 15:29:34  _____________________________________________________________________  External Attachment:    Type:   Image     Comment:   External Document

## 2010-02-27 NOTE — Letter (Signed)
Summary: [facility] P[facility] RECORDS  [facility] RECORDS   Imported By: Arta Bruce 05/26/2009 15:38:17  _____________________________________________________________________  External Attachment:    Type:   Image     Comment:   External Document

## 2010-02-27 NOTE — Letter (Signed)
Summary: [facility]  [facility]   Imported By: Arta Bruce 03/09/2009 14:46:41  _____________________________________________________________________  External Attachment:    Type:   Image     Comment:   External Document

## 2010-02-27 NOTE — Miscellaneous (Signed)
Summary: EGD and Colo 4.2011  Clinical Lists Changes  Problems: Assessed ANEMIA-IRON DEFICIENCY as comment only - EGD and Colo normal in 4.2011  Her updated medication list for this problem includes:    Nu-iron 150 Mg Caps (Polysaccharide iron complex) .Marland Kitchen... Take 1 tablet by mouth two times a day  Observations: Added new observation of PRIMARY MD: Tereso Newcomer PA-C (08/27/2009 14:20) Added new observation of PAST MED HX: Anemia-iron deficiency Asthma Obesity Paresthesias Palpitations   a.  event monitor/holter:  NSR, ST, PVC, PAC 3.2011   b.  ETT: "ok" per card notes   c.  Echo: "ok" per card. notes   d.  Dr. Armanda Magic [facility] Card. Fatigue Menorrhagia B12 deficiency Vitamin D Deficiency Pap 04/2008:  normal except + Trich Mammo 09/2008:  normal EGD 4.26.2011:  Normal EGD; nothing to explain anemia. Colo 4.26.2011:  Normal to cecum. (08/27/2009 14:20) Added new observation of EGD: Findings: Normal  Location: [facility]  Dr. Herbert Moors (05/23/2009 14:23) Added new observation of COLONRECACT: Repeat colonoscopy in 10 years.   (05/23/2009 14:23) Added new observation of COLONOSCOPY:  Results: Normal. Location:  [facility].  Dr. Herbert Moors [phone]/26/2011 14:23)      Colonoscopy  Procedure date:  05/23/2009  Findings:       Results: Normal. Location:  [facility].  Dr. Herbert Moors  Comments:      Repeat colonoscopy in 10 years.    EGD  Procedure date:  05/23/2009  Findings:      Findings: Normal  Location: [facility]  Dr. Herbert Moors   Impression & Recommendations:  Problem # 1:  ANEMIA-IRON DEFICIENCY (ICD-280.9) EGD and Colo normal in 4.2011  Her updated medication list for this problem includes:    Nu-iron 150 Mg Caps (Polysaccharide iron complex) .Marland Kitchen... Take 1 tablet by mouth two times a day  Complete Medication List: 1)  Pepcid 20 Mg Tabs (Famotidine) .... Take 1 tablet by mouth two times a day 2)   Nu-iron 150 Mg Caps (Polysaccharide iron complex) .... Take 1 tablet by mouth two times a day 3)  Ergocalciferol 50000 Unit Caps (Ergocalciferol) .Marland Kitchen.. 1 by mouth every week for 12 weeks. 4)  Ventolin Hfa 108 (90 Base) Mcg/act Aers (Albuterol sulfate) .Marland Kitchen.. 1-2 puffs every 4-6 hours as needed 5)  Metoprolol Tartrate 25 Mg Tabs (Metoprolol tartrate) .... Take 1 by mouth as needed for palpitations.   Past History:  Past Medical History: Anemia-iron deficiency Asthma Obesity Paresthesias Palpitations   a.  event monitor/holter:  NSR, ST, PVC, PAC 3.2011   b.  ETT: "ok" per card notes   c.  Echo: "ok" per card. notes   d.  Dr. Armanda Magic [facility] Card. Fatigue Menorrhagia B12 deficiency Vitamin D Deficiency Pap 04/2008:  normal except + Trich Mammo 09/2008:  normal EGD 4.26.2011:  Normal EGD; nothing to explain anemia. Colo 4.26.2011:  Normal to cecum.

## 2010-02-27 NOTE — Letter (Signed)
Summary: REFERRAL//[facility]//AAPPT DATE &TIME  REFERRAL//[facility]//AAPPT DATE &TIME   Imported By: Arta Bruce 04/03/2009 12:33:18  _____________________________________________________________________  External Attachment:    Type:   Image     Comment:   External Document

## 2010-02-27 NOTE — Assessment & Plan Note (Signed)
Summary: 6 WEEK FU FOR PALPATATIONS///KT   Vital Signs:  Patient profile:   45 year old female Height:      61.5 inches Weight:      267 pounds BMI:     49.81 O2 Sat:      98 % on Room air Temp:     98.4 degrees F oral Pulse rate:   85 / minute Pulse rhythm:   regular Resp:     20 per minute BP sitting:   134 / 82  (left arm) Cuff size:   large  Vitals Entered By: [place] Shannon (March 28, 2009 8:32 AM)  O2 Flow:  Room air CC: 6 week f/u palpations.... pt says she had another episode which lasted an hour and thirty min. Is Patient Diabetic? No  Does patient need assistance? Functional Status Self care Ambulation Normal   Primary Care Provider:  Carolyne Fiscal  CC:  6 week f/u palpations.... pt says she had another episode which lasted an hour and thirty min.Marland Kitchen  History of Present Illness: Here for f/u.  Palps:  Has not seen card. yet.  As noted previously, she had a normal echo in past.  She had 24 hour urine that was neg for pheo.  She had another episode about 2 weeks ago.  Woke her up in the middle of the night.  She had tachypalps.  + diaphoretic.  She could hear her heartbeat in her ears.  She felt like she had to urinate.  No near syncope.  No syncope.  No chest pain.  She was slightly short of breath.  She had some abdominal discomfort.  She had a bowel movement with this.  It was black.  No hematochezia.  She is supposed to be on PPI, but has not had it filled.  She also noted some left leg discomfort when she got out of bed.  This improved with moving around.  Stools:  She has continued to have dark stools since.  Her stool cards were neg. from the last visit.  She has felt weak and tired.  She does not use a lot of ibuprofen.  No hematemesis.    Menorrhagia:  Saw GYN and had endometrial bx.  THis was neg.  She is to f/u in a couple weeks.  She states she had a pap in 09/2008.  The notes indicate that and IUD may be recommended to help with her menorrhagia.    Asthma History    Initial Asthma Severity Rating:    Age range: 12+ years    Symptoms: 0-2 days/week    Nighttime Awakenings: 0-2/month    Interferes w/ normal activity: no limitations    SABA use (not for EIB): 0-2 days/week    Asthma Severity Assessment: Intermittent  Problems Prior to Update: 1)  Melena, Hx of  (ICD-V12.79) 2)  Other Impaction of Intestine  (ICD-560.39) 3)  Vitamin D Deficiency  (ICD-268.9) 4)  Abscess, Tooth  (ICD-522.5) 5)  Fibroids, Uterus  (ICD-218.9) 6)  Abdominal Bloating  (ICD-787.3) 7)  Menorrhagia  (ICD-626.2) 8)  Preventive Health Care  (ICD-V70.0) 9)  Palpitations, Recurrent  (ICD-785.1) 10)  Asthma  (ICD-493.90) 11)  Anemia-iron Deficiency  (ICD-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    Physical Exam  General:  alert, well-developed, and well-nourished.   Head:  normocephalic and atraumatic.   Neck:  supple and no carotid bruits.   Lungs:  normal breath sounds, no crackles, and no wheezes.   Heart:  normal rate and regular rhythm.   Abdomen:  soft and no hepatomegaly.   very mild epigastric tenderness Rectal:  no external abnormalities, normal sphincter tone, no masses, no tenderness, and external hemorrhoid(s).   Extremities:  no edema  Neurologic:  alert & oriented X3 and cranial nerves II-XII intact.   Psych:  normally interactive.     Impression & Recommendations:  Problem # 1:  PALPITATIONS, RECURRENT (ICD-785.1)  sounds very suspicious for parox AFib will give her a Rx for metoprolol to take as needed she is due to see cardiology next week defer ASA 2/2 #2 problem  Her updated medication list for this problem includes:    Metoprolol Tartrate 25 Mg Tabs (Metoprolol tartrate) .Marland Kitchen... Take 1 by mouth as needed for palpitations.  Problem # 2:  MELENA, HX OF (ICD-V12.79)  by  her description she has iron def anemia that we thought was due to menorrhagia but, her description of her stools is worrisome she is supposed to be on a PPI for abd bloating but could not afford to get med we only carry pepcid . .  . so will give her pepcid for now which is better than nothing advised her she can get her meds and pay later at our pharmacy will check cbc, bmet today refer to GI  Orders: T-CBC w/Diff ([id number]) T-Basic Metabolic Panel ([id number]) Gastroenterology Referral (GI)  Problem # 3:  MENORRHAGIA (ICD-626.2) f/u with GYN clinic  Problem # 4:  ASTHMA (ICD-493.90)  out of ventolin .. . needs refill very mild symptoms  Her updated medication list for this problem includes:    Ventolin Hfa 108 (90 Base) Mcg/act Aers (Albuterol sulfate) .Marland Kitchen... 1-2 puffs every 4-6 hours as needed  Problem # 5:  PREVENTIVE HEALTH CARE (ICD-V70.0) pap done in 09/2008  Complete Medication List: 1)  Pepcid 20 Mg Tabs (Famotidine) .... Take 1 tablet by mouth two times a day 2)  Nu-iron 150 Mg Caps (Polysaccharide iron complex) .... Take 1 tablet by mouth two times a day 3)  Ergocalciferol 50000 Unit Caps (Ergocalciferol) .Marland Kitchen.. 1 by mouth every week for 12 weeks. 4)  Ventolin Hfa 108 (90 Base) Mcg/act Aers (Albuterol sulfate) .Marland Kitchen.. 1-2 puffs every 4-6 hours as needed 5)  Metoprolol Tartrate 25 Mg Tabs (Metoprolol tartrate) .... Take 1 by mouth as needed for palpitations.   Patient Instructions: 1)  Take the metoprolol if your palpitations are rapid and do not go away after 10-15 minutes. 2)  Go to your cardiology appt. 3)  Go to the emergency room if you develop recurrent black stools and feel poorly with them. 4)  We will set you up with an appt with gastroenterology for your black stools. Prescriptions: METOPROLOL TARTRATE 25 MG TABS (METOPROLOL TARTRATE) Take 1 by mouth as needed for palpitations.  #30 x 1   Entered and Authorized by:   Tereso Newcomer PA-C   Signed by:   Tereso Newcomer PA-C on 03/28/2009   Method used:   Print then Give to Patient   RxID:   [id number] VENTOLIN HFA 108 (90 BASE) MCG/ACT AERS (ALBUTEROL SULFATE) 1-2 puffs every 4-6 hours as needed  #1 x 11   Entered and Authorized by:   Tereso Newcomer PA-C   Signed by:   Tereso Newcomer PA-C on 03/28/2009   Method used:   Print then Give to Patient  RxID:   [id number] PEPCID 20 MG TABS (FAMOTIDINE) Take 1 tablet by mouth two times a day  #60 x 5   Entered and Authorized by:   Tereso Newcomer PA-C   Signed by:   Tereso Newcomer PA-C on 03/28/2009   Method used:   Print then Give to Patient   RxID:   [id number]   Laboratory Results    Stool - Occult Blood Hemmoccult #1: negative Date: 03/28/2009

## 2010-02-27 NOTE — Letter (Signed)
Summary: [facility]   Imported By: Arta Bruce 09/28/2009 15:32:36  _____________________________________________________________________  External Attachment:    Type:   Image     Comment:   External Document

## 2010-05-07 LAB — POCT CARDIAC MARKERS
CKMB, poc: <1 ng/mL — ABNORMAL LOW (ref 1.0–8.0)
Troponin i, poc: <0.05 ng/mL (ref 0.00–0.09)

## 2010-05-07 LAB — COMPREHENSIVE METABOLIC PANEL
Albumin: 3.5 g/dL (ref 3.5–5.2)
BUN: 6 mg/dL (ref 6–23)
Calcium: 9.1 mg/dL (ref 8.4–10.5)
Creatinine, Ser: 0.84 mg/dL (ref 0.4–1.2)
Potassium: 4.1 mEq/L (ref 3.5–5.1)
Total Protein: 6.7 g/dL (ref 6.0–8.3)

## 2010-05-07 LAB — DIFFERENTIAL
Lymphocytes Relative: 21 % (ref 12–46)
Lymphs Abs: 2 10*3/uL (ref 0.7–4.0)
Monocytes Absolute: 0.6 10*3/uL (ref 0.1–1.0)
Monocytes Relative: 7 % (ref 3–12)
Neutro Abs: 6.2 10*3/uL (ref 1.7–7.7)

## 2010-05-07 LAB — CBC
HCT: 38.4 % (ref 36.0–46.0)
MCV: 79.4 fL (ref 78.0–100.0)
Platelets: 359 10*3/uL (ref 150–400)
RDW: 13.4 % (ref 11.5–15.5)

## 2010-10-19 IMAGING — CR DG CHEST 1V PORT
1 series · 1 of 1 positions shown · non-contrast
Comparison: None.

CLINICAL DATA: 43-year-old female with heart racing.

PORTABLE CHEST - 1 VIEW

[AP]
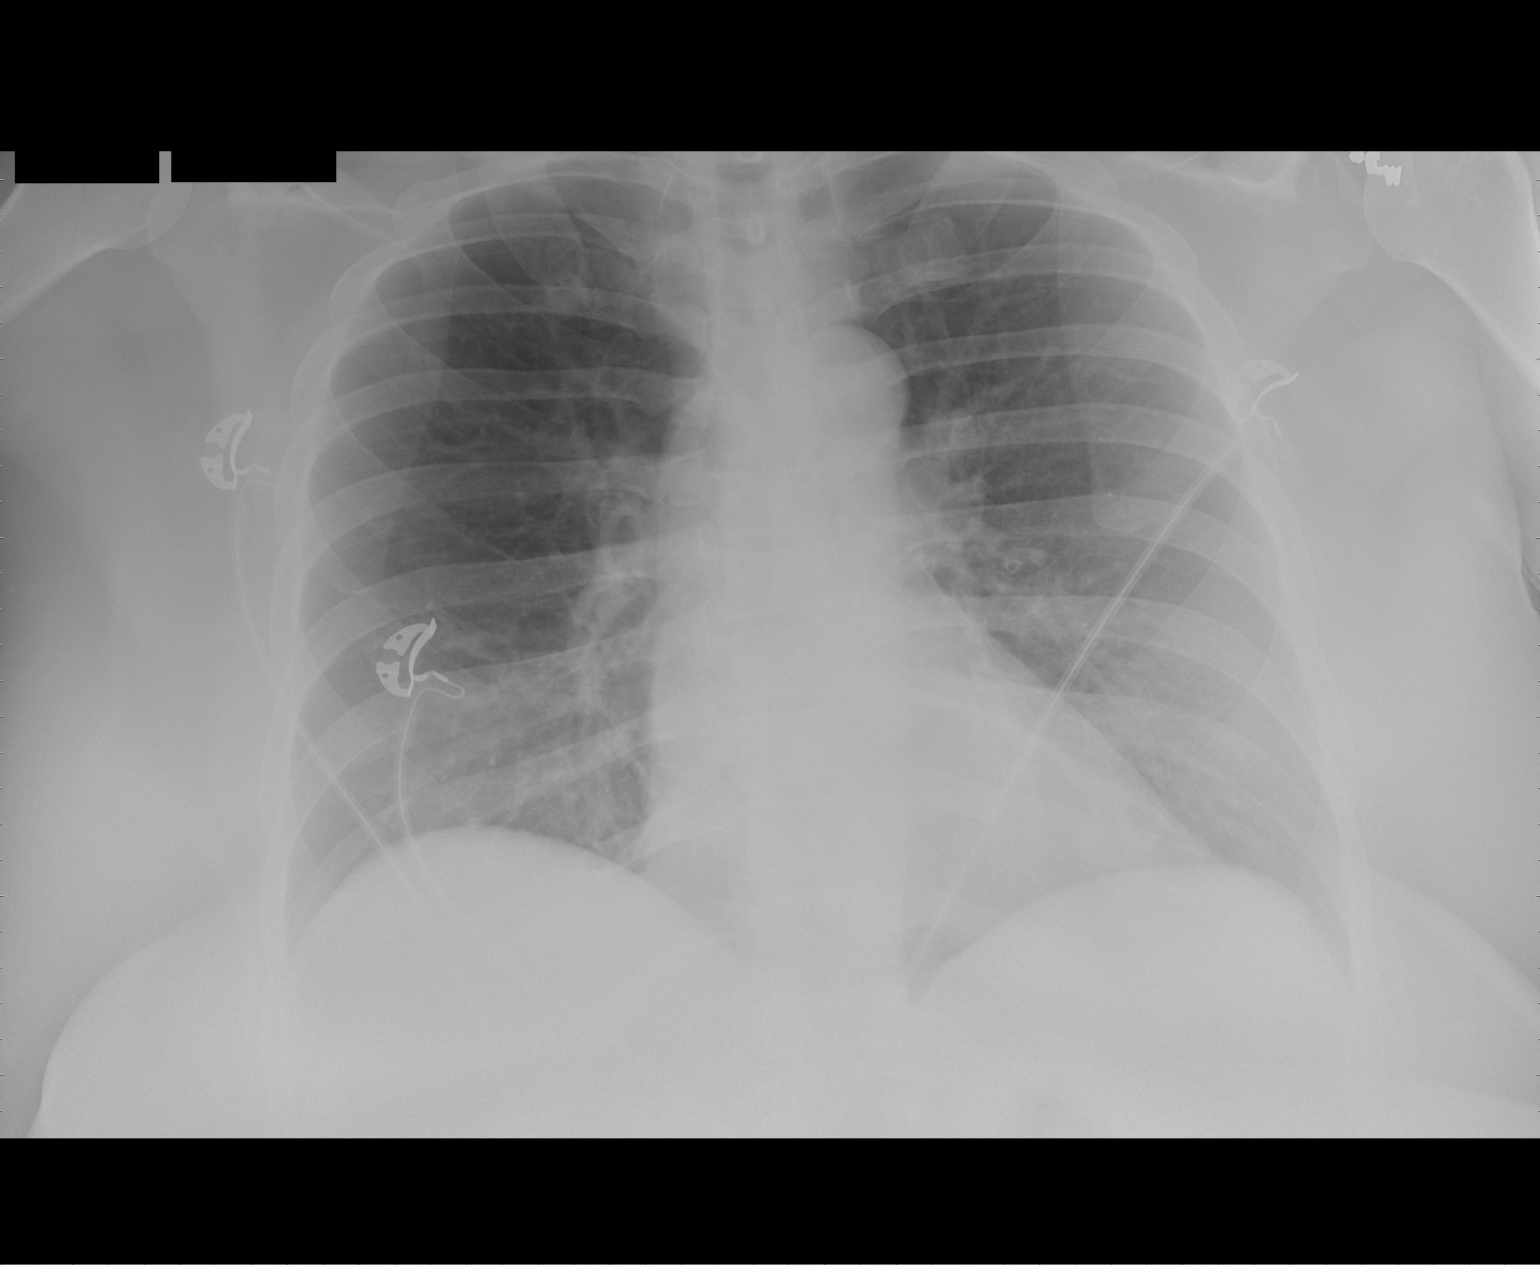

[1 of 1 positions shown; findings below may reference images not displayed]

FINDINGS: AP portable seated upright view at 0823 hours.  Slightly
shallow lung volumes.  Cardiac size and mediastinal contours are
within normal limits.  The lungs are clear.
IMPRESSION: No acute cardiopulmonary abnormality.

## 2010-11-02 ENCOUNTER — Encounter: Payer: Self-pay | Admitting: Hematology and Oncology

## 2011-05-08 IMAGING — US US PELVIS COMPLETE MODIFY
1 series · 14 of 25 positions shown · non-contrast
Comparison: None.

CLINICAL DATA: Menorrhagia.

TRANSABDOMINAL AND TRANSVAGINAL ULTRASOUND OF PELVIS 01/16/2009:
TECHNIQUE: Both transabdominal and transvaginal ultrasound
examinations of the pelvis were performed including evaluation of
the uterus, ovaries, adnexal regions, and pelvic cul-de-sac.

[Series 1: us pelvis complete modify · 0.35mm/px · 14 of 56 slices shown]
[im 1/56]
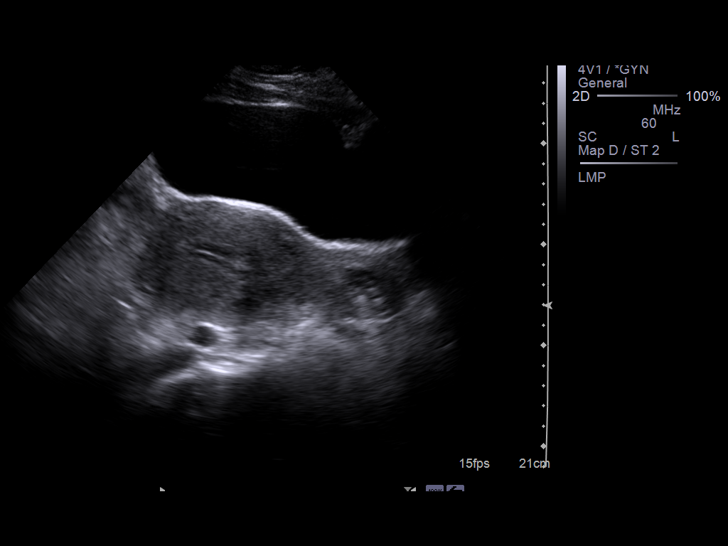
[im 5/56]
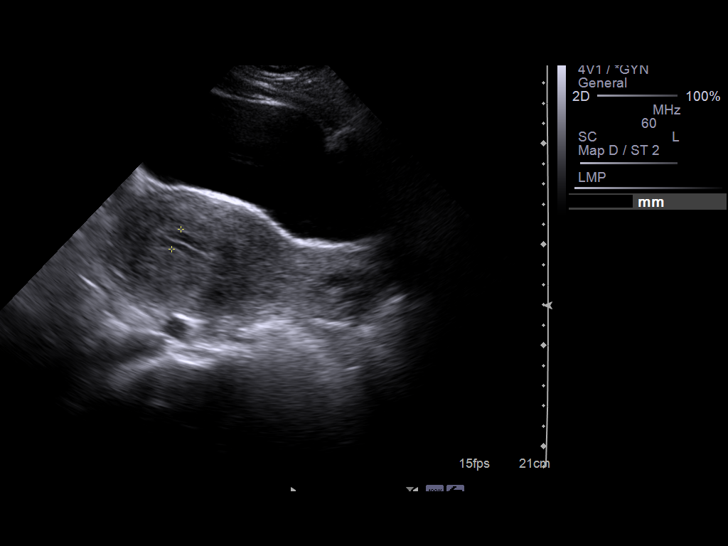
[im 10/56]
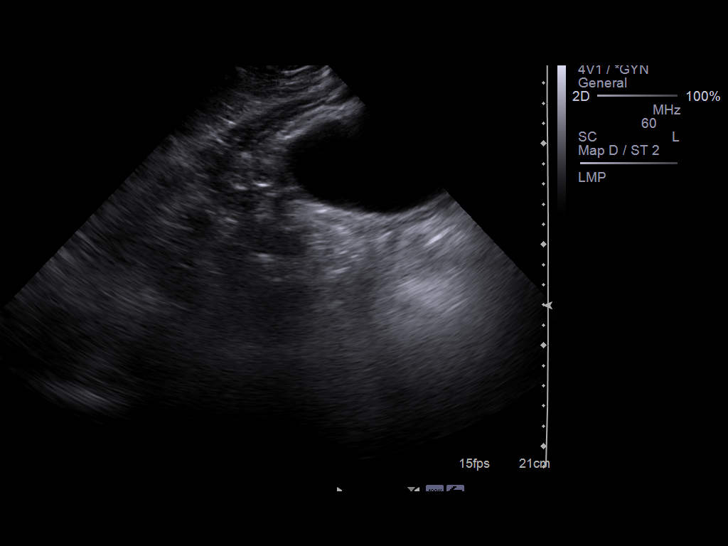
[im 14/56]
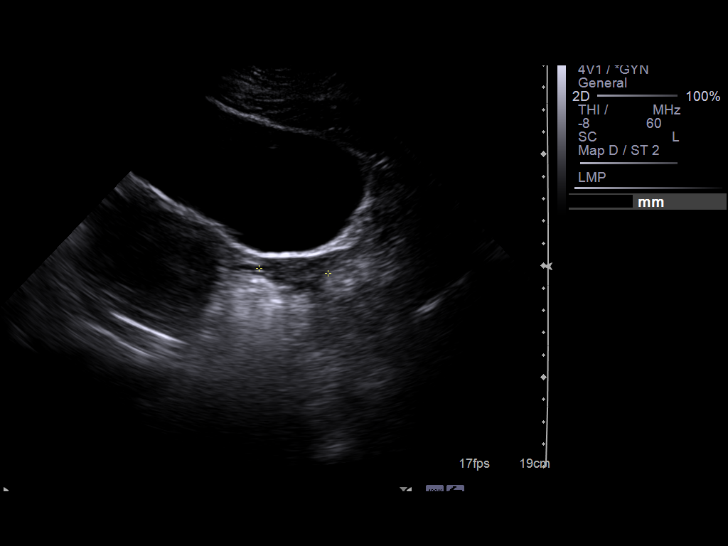
[im 19/56]
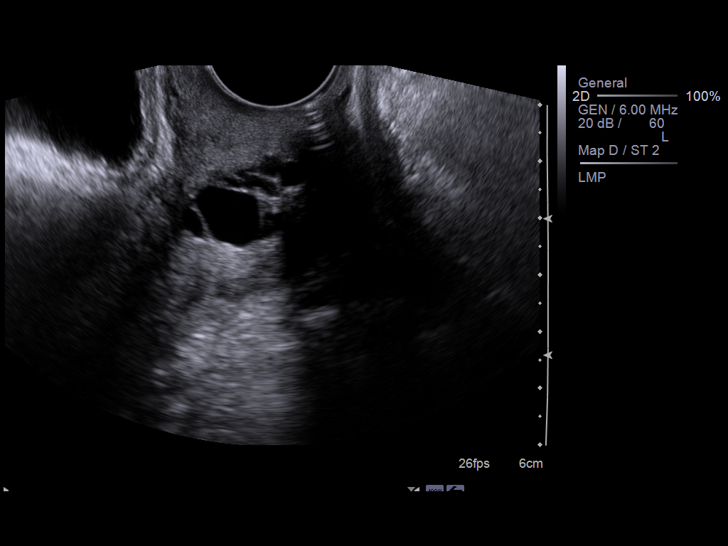
[im 21/56]
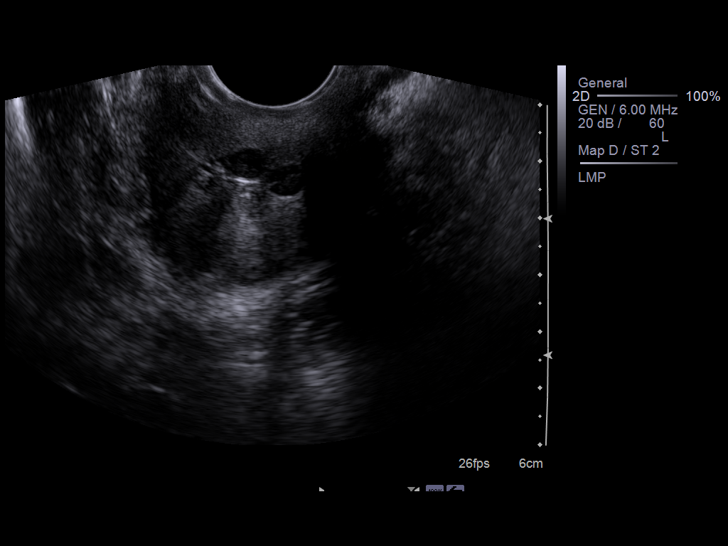
[im 26/56]
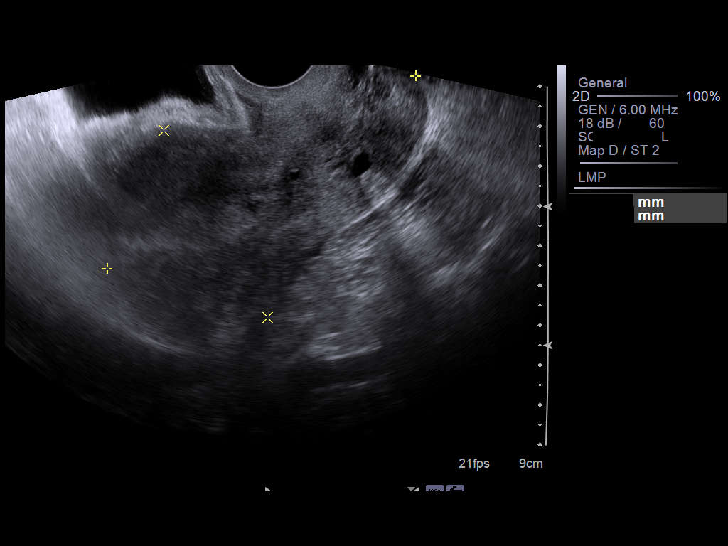
[im 30/56]
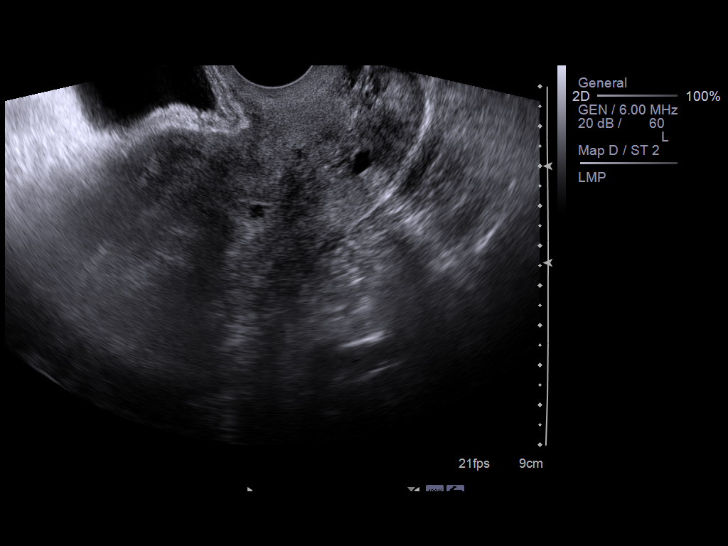
[im 35/56]
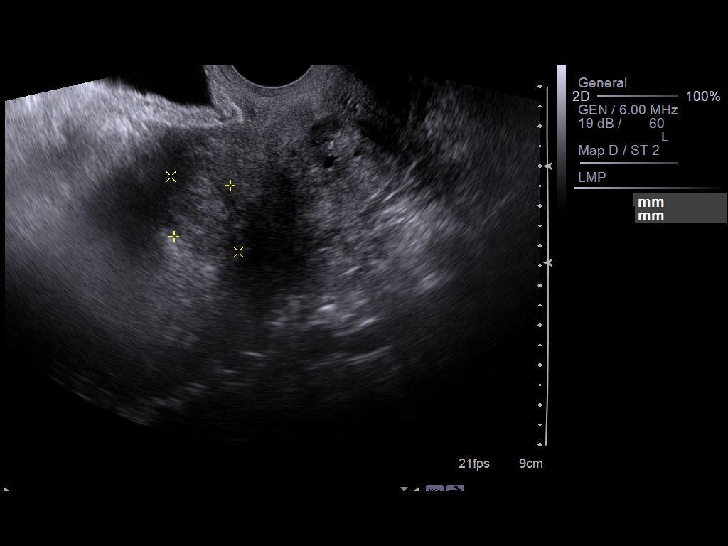
[im 37/56]
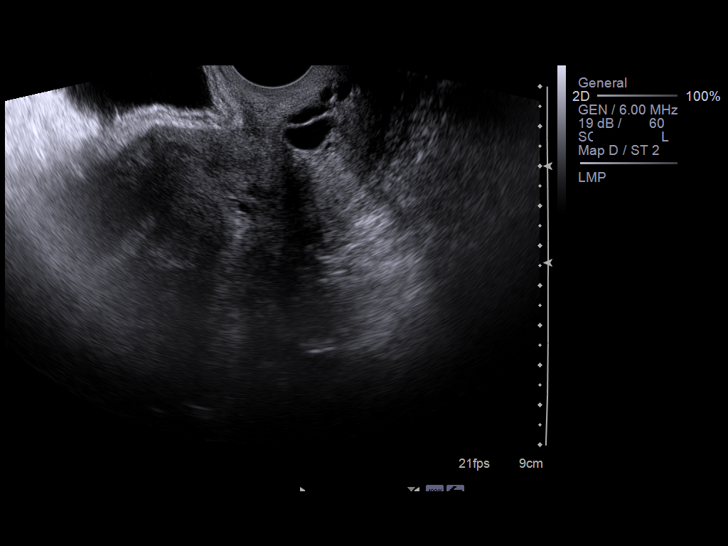
[im 42/56]
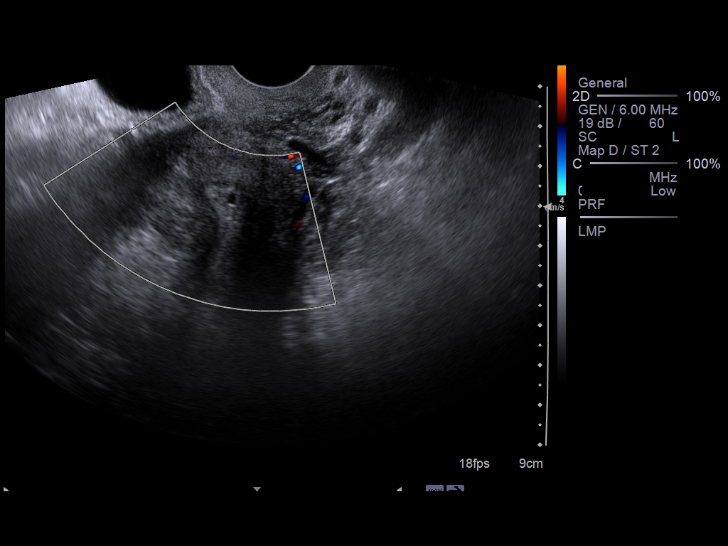
[im 46/56]
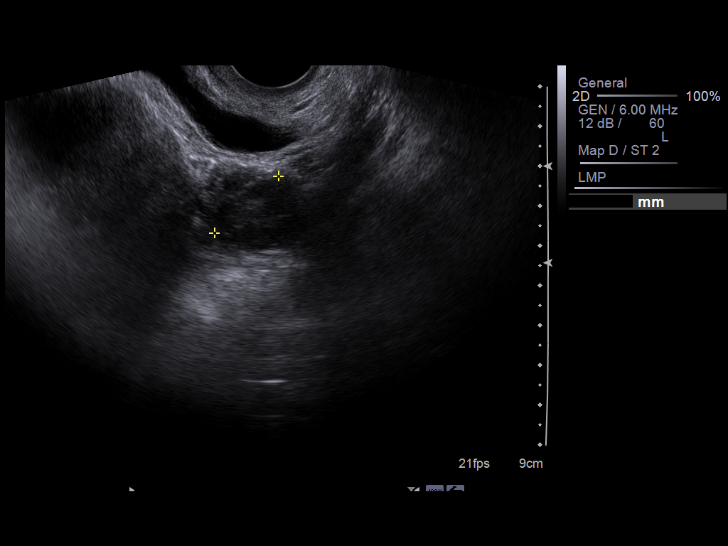
[im 51/56]
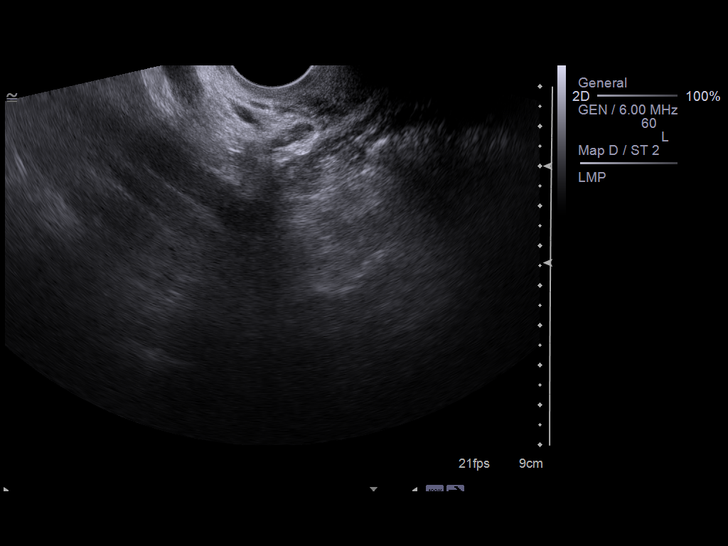
[im 56/56]
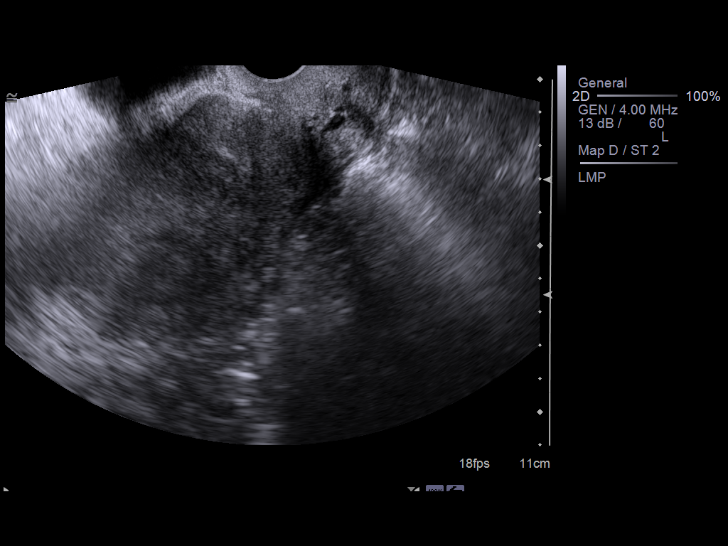

[14 of 25 positions shown; findings below may reference images not displayed]

FINDINGS: Uterus upper normal in size measuring approximately 9.1 x 5.4 x
cm.  An approximate 1.9 x 2.5 x 2.1 cm fibroid is identified in the
submucosal region of the uterine fundus.  No other discrete
fibroids.

Endometrium measures approximately 12 mm in thickness.  No visible
endometrial fluid or mass.  Mass effect upon the endometrium in the
fundus due to the submucosal fibroid.

Right Ovary not visualized either transabdominally or
transvaginally.

Left Ovary normal in size and appearance measuring approximately
2.9 x 1.9 x 2.2 cm.

Other Findings:  No adnexal masses or free fluid.
IMPRESSION: 1.  Approximate 2.5 cm submucosal fibroid in the uterine fundus
causing mass effect upon the endometrial stripe.
2.  No endometrial fluid or mass.
3.  Normal-appearing left ovary.  Right ovary not visualized.

## 2012-02-25 DIAGNOSIS — Z9289 Personal history of other medical treatment: Secondary | ICD-10-CM

## 2012-02-25 HISTORY — DX: Personal history of other medical treatment: Z92.89

## 2012-03-09 ENCOUNTER — Telehealth: Payer: Self-pay | Admitting: Obstetrics and Gynecology

## 2012-03-09 NOTE — Telephone Encounter (Signed)
LVM for pt to return call.   Johnny Gorter, CMA  

## 2012-03-10 NOTE — Telephone Encounter (Signed)
2nd attempt to contact pt.  LVM for pt to return call.  Darien Ramus, CMA

## 2012-03-13 NOTE — Telephone Encounter (Signed)
3rd attempt to contact pt. Left VM for pt to return call.  Darien Ramus, CMA

## 2012-06-15 ENCOUNTER — Other Ambulatory Visit: Payer: Self-pay | Admitting: Obstetrics and Gynecology

## 2012-06-24 ENCOUNTER — Encounter (HOSPITAL_COMMUNITY): Payer: Self-pay | Admitting: *Deleted

## 2012-06-24 NOTE — Progress Notes (Signed)
Copy of SDS BB History Log given to Lab for prior history of blood transfusion, 2 units in 01/2012 at [facility].

## 2012-06-29 ENCOUNTER — Encounter (HOSPITAL_COMMUNITY): Payer: Self-pay | Admitting: Obstetrics and Gynecology

## 2012-06-30 ENCOUNTER — Ambulatory Visit (HOSPITAL_COMMUNITY): Payer: Managed Care, Other (non HMO) | Admitting: Anesthesiology

## 2012-06-30 ENCOUNTER — Encounter (HOSPITAL_COMMUNITY): Payer: Self-pay | Admitting: Pharmacist

## 2012-06-30 ENCOUNTER — Ambulatory Visit (HOSPITAL_COMMUNITY)
Admission: RE | Admit: 2012-06-30 | Discharge: 2012-06-30 | Disposition: A | Discharge status: Home / Self Care | Payer: Managed Care, Other (non HMO) | Source: Ambulatory Visit | Attending: Obstetrics and Gynecology | Admitting: Obstetrics and Gynecology

## 2012-06-30 ENCOUNTER — Encounter (HOSPITAL_COMMUNITY): Payer: Self-pay | Admitting: Anesthesiology

## 2012-06-30 ENCOUNTER — Encounter (HOSPITAL_COMMUNITY)
Admission: RE | Disposition: A | Discharge status: Home / Self Care | Payer: Self-pay | Source: Ambulatory Visit | Attending: Obstetrics and Gynecology

## 2012-06-30 DIAGNOSIS — D649 Anemia, unspecified: Secondary | ICD-10-CM | POA: Insufficient documentation

## 2012-06-30 DIAGNOSIS — N920 Excessive and frequent menstruation with regular cycle: Secondary | ICD-10-CM | POA: Insufficient documentation

## 2012-06-30 DIAGNOSIS — D250 Submucous leiomyoma of uterus: Secondary | ICD-10-CM | POA: Insufficient documentation

## 2012-06-30 HISTORY — PX: DILITATION & CURRETTAGE/HYSTROSCOPY WITH VERSAPOINT RESECTION: SHX5571

## 2012-06-30 HISTORY — DX: Unspecified asthma, uncomplicated: J45.909

## 2012-06-30 HISTORY — DX: Personal history of other medical treatment: Z92.89

## 2012-06-30 HISTORY — DX: Anxiety disorder, unspecified: F41.9

## 2012-06-30 HISTORY — DX: Anemia, unspecified: D64.9

## 2012-06-30 LAB — CBC
Hemoglobin: 8 g/dL — ABNORMAL LOW (ref 12.0–15.0)
MCV: 58.6 fL — ABNORMAL LOW (ref 78.0–100.0)
Platelets: 399 10*3/uL (ref 150–400)
RBC: 5.03 MIL/uL (ref 3.87–5.11)
WBC: 9.3 10*3/uL (ref 4.0–10.5)

## 2012-06-30 SURGERY — DILATATION & CURETTAGE/HYSTEROSCOPY WITH VERSAPOINT RESECTION
Anesthesia: General | Site: Vagina | Wound class: Clean Contaminated

## 2012-06-30 MED ORDER — KETOROLAC TROMETHAMINE 30 MG/ML IJ SOLN
INTRAMUSCULAR | Status: DC | PRN
  Administered 2012-06-30: 30 mg via INTRAMUSCULAR
  Administered 2012-06-30: 30 mg via INTRAVENOUS

## 2012-06-30 MED ORDER — MIDAZOLAM HCL 2 MG/2ML IJ SOLN
INTRAMUSCULAR | Status: AC
  Filled 2012-06-30: qty 2

## 2012-06-30 MED ORDER — KETOROLAC TROMETHAMINE 30 MG/ML IJ SOLN: 15.0000 mg | Freq: Once | INTRAMUSCULAR | Status: DC | PRN

## 2012-06-30 MED ORDER — MIDAZOLAM HCL 5 MG/5ML IJ SOLN
INTRAMUSCULAR | Status: DC | PRN
  Administered 2012-06-30: 2 mg via INTRAVENOUS

## 2012-06-30 MED ORDER — FUSION PLUS PO CAPS: 1.0000 | ORAL_CAPSULE | Freq: Two times a day (BID) | ORAL | Status: DC

## 2012-06-30 MED ORDER — FENTANYL CITRATE 0.05 MG/ML IJ SOLN
INTRAMUSCULAR | Status: AC
  Filled 2012-06-30: qty 2

## 2012-06-30 MED ORDER — LIDOCAINE HCL (CARDIAC) 20 MG/ML IV SOLN
INTRAVENOUS | Status: DC | PRN
  Administered 2012-06-30: 100 mg via INTRAVENOUS

## 2012-06-30 MED ORDER — SODIUM CHLORIDE 0.9 % IR SOLN
Status: DC | PRN
  Administered 2012-06-30: 3000 mL

## 2012-06-30 MED ORDER — IBUPROFEN 600 MG PO TABS: 600.0000 mg | ORAL_TABLET | Freq: Four times a day (QID) | ORAL | Status: DC

## 2012-06-30 MED ORDER — FENTANYL CITRATE 0.05 MG/ML IJ SOLN
INTRAMUSCULAR | Status: AC
  Administered 2012-06-30: 50 ug via INTRAVENOUS
  Filled 2012-06-30: qty 2

## 2012-06-30 MED ORDER — FENTANYL CITRATE 0.05 MG/ML IJ SOLN
25.0000 ug | INTRAMUSCULAR | Status: DC | PRN
  Administered 2012-06-30: 50 ug via INTRAVENOUS

## 2012-06-30 MED ORDER — HYDROCODONE-ACETAMINOPHEN 5-325 MG PO TABS: 1.0000 | ORAL_TABLET | Freq: Four times a day (QID) | ORAL | Status: DC | PRN

## 2012-06-30 MED ORDER — PROPOFOL 10 MG/ML IV BOLUS
INTRAVENOUS | Status: DC | PRN
  Administered 2012-06-30: 150 mg via INTRAVENOUS

## 2012-06-30 MED ORDER — LIDOCAINE HCL (CARDIAC) 20 MG/ML IV SOLN
INTRAVENOUS | Status: AC
  Filled 2012-06-30: qty 5

## 2012-06-30 MED ORDER — DEXAMETHASONE SODIUM PHOSPHATE 10 MG/ML IJ SOLN
INTRAMUSCULAR | Status: AC
  Filled 2012-06-30: qty 1

## 2012-06-30 MED ORDER — LACTATED RINGERS IV SOLN
INTRAVENOUS | Status: DC
  Administered 2012-06-30 (×2): via INTRAVENOUS

## 2012-06-30 MED ORDER — FENTANYL CITRATE 0.05 MG/ML IJ SOLN
INTRAMUSCULAR | Status: DC | PRN
  Administered 2012-06-30 (×3): 50 ug via INTRAVENOUS

## 2012-06-30 MED ORDER — LIDOCAINE HCL 2 % IJ SOLN
INTRAMUSCULAR | Status: AC
  Filled 2012-06-30: qty 20

## 2012-06-30 MED ORDER — ONDANSETRON HCL 4 MG/2ML IJ SOLN
INTRAMUSCULAR | Status: DC | PRN
  Administered 2012-06-30: 4 mg via INTRAVENOUS

## 2012-06-30 MED ORDER — PROPOFOL 10 MG/ML IV EMUL
INTRAVENOUS | Status: AC
  Filled 2012-06-30: qty 20

## 2012-06-30 MED ORDER — DEXAMETHASONE SODIUM PHOSPHATE 4 MG/ML IJ SOLN
INTRAMUSCULAR | Status: DC | PRN
  Administered 2012-06-30: 10 mg via INTRAVENOUS

## 2012-06-30 MED ORDER — ONDANSETRON HCL 4 MG/2ML IJ SOLN
INTRAMUSCULAR | Status: AC
  Filled 2012-06-30: qty 2

## 2012-06-30 MED ORDER — DEXTROSE 5 % IV SOLN
2.0000 g | Freq: Once | INTRAVENOUS | Status: AC
  Administered 2012-06-30: 2 g via INTRAVENOUS
  Filled 2012-06-30: qty 2

## 2012-06-30 MED ORDER — LIDOCAINE HCL 1 % IJ SOLN
INTRAMUSCULAR | Status: DC | PRN
  Administered 2012-06-30: 10 mL

## 2012-06-30 SURGICAL SUPPLY — 26 items
BOOTIES KNEE HIGH SLOAN (MISCELLANEOUS) ×4 IMPLANT
CANISTER SUCTION 2500CC (MISCELLANEOUS) ×2 IMPLANT
CATH ROBINSON RED A/P 16FR (CATHETERS) ×2 IMPLANT
CATH THERMACHOICE III (CATHETERS) ×2 IMPLANT
CLOTH BEACON ORANGE TIMEOUT ST (SAFETY) ×2 IMPLANT
CONTAINER PREFILL 10% NBF 60ML (FORM) ×4 IMPLANT
CORD ACTIVE DISPOSABLE (ELECTRODE)
CORD ELECTRO ACTIVE DISP (ELECTRODE) IMPLANT
DILATOR CANAL MILEX (MISCELLANEOUS) IMPLANT
DRESSING TELFA 8X3 (GAUZE/BANDAGES/DRESSINGS) ×2 IMPLANT
ELECT LOOP GYNE PRO 24FR (CUTTING LOOP)
ELECT REM PT RETURN 9FT ADLT (ELECTROSURGICAL) ×2
ELECT VAPORTRODE GRVD BAR (ELECTRODE) IMPLANT
ELECTRODE LOOP GYNE PRO 24FR (CUTTING LOOP) IMPLANT
ELECTRODE REM PT RTRN 9FT ADLT (ELECTROSURGICAL) ×1 IMPLANT
ELECTRODE ROLLER VERSAPOINT (ELECTRODE) IMPLANT
ELECTRODE RT ANGLE VERSAPOINT (CUTTING LOOP) IMPLANT
ELECTRODE TWIZZLE TIP (MISCELLANEOUS) IMPLANT
GAUZE SPONGE 4X4 12PLY STRL LF (GAUZE/BANDAGES/DRESSINGS) ×4 IMPLANT
GLOVE SURG SS PI 6.5 STRL IVOR (GLOVE) ×4 IMPLANT
GOWN STRL REIN XL XLG (GOWN DISPOSABLE) ×6 IMPLANT
LOOP ANGLED CUTTING 22FR (CUTTING LOOP) IMPLANT
PACK HYSTEROSCOPY LF (CUSTOM PROCEDURE TRAY) ×2 IMPLANT
PAD OB MATERNITY 4.3X12.25 (PERSONAL CARE ITEMS) ×2 IMPLANT
TOWEL OR 17X24 6PK STRL BLUE (TOWEL DISPOSABLE) ×4 IMPLANT
WATER STERILE IRR 1000ML POUR (IV SOLUTION) ×2 IMPLANT

## 2012-06-30 NOTE — Anesthesia Postprocedure Evaluation (Signed)
  Anesthesia Post Note  Patient: Haley Weber  Procedure(s) Performed: Procedure(s) (LRB): DILATATION & CURETTAGE/HYSTEROSCOPY WITH thermachoice (N/A)  Anesthesia type: GA  Patient location: PACU  Post pain: Pain level controlled  Post assessment: Post-op Vital signs reviewed  Last Vitals:  Filed Vitals:   06/30/12 1345  BP: 132/70  Pulse: 95  Temp: 37.1 C  Resp: 17    Post vital signs: Reviewed  Level of consciousness: sedated  Complications: No apparent anesthesia complications

## 2012-06-30 NOTE — Op Note (Signed)
06/30/2012  1:34 PM  PATIENT:  Haley Weber  47 y.o. female  PRE-OPERATIVE DIAGNOSIS:  Fibroid,  Menorrhagia Anemia  POST-OPERATIVE DIAGNOSIS:  Same  PROCEDURE:  Procedure(s): DILATATION & CURETTAGE/HYSTEROSCOPY THERMACHOICE ENDOMETRIAL ABLATION  SURGEON:  HAYGOOD,VANESSA P, MD  ASSISTANTS: NONE  ANESTHESIA:   general  ESTIMATED BLOOD LOSS: * No blood loss amount entered *   BLOOD ADMINISTERED:none  COMPLICATIONS:NONE  FINDINGS: Uterus sounded to 10cm. At hysteroscopy, no specific endometrial lesions were noted.  A moderated amount of endometrial curretings was obtained at currettage  FLUID DEFICIT:60cc  LOCAL MEDICATIONS USED:  XYLOCAINE  and Amount: 10 ml  SPECIMEN:  Source of Specimen:  endometrial curettings  DISPOSITION OF SPECIMEN:  PATHOLOGY  COUNTS:  YES  DESCRIPTION OF PROCEDURE:the patient was taken to the operating room after appropriate identification and placed on the operating table. After the attainment of adequate general anesthesia she was placed in the lithotomy position. The perineum and vagina were prepped with multiple layers of Betadine. The bladder was emptied with a an in and out catheter. The perineum was draped in sterile field. A gray speculum was placed in the vagina. The cervix was grasped with a single-tooth tenaculum. A paracervical block was achieved with a total of 10 cc of 2% Xylocaine and the 5 and 7:00 positions. The uterus was sounded.  The cervix was then dilated to accommodate the diagnostic hysteroscope. The hysteroscope was used to evaluate all quadrants of the uterus.  The above noted findings were made.   The hysteroscope was removed and and the uterus curetted in all quadrants of the uterus. A moderate amount of tissue was obtained and the hysteroscope replaced to document continued integrity of the uterus. The ThermaChoice endometrial ablation system was then prepared by climbing. The balloon and creating a negative pressure  with in the balloon. Greater than -150 mmHg. The balloon was then inserted to within 1 cm in depth and found. The and the balloon filled with warm D5W to achieve a stable pressure of greater than 180 mm of mercury. The heating cycle was begun and wants a temperature of 87C was obtained, the treatment cycle, which lasted for 8 minutes. . The cooldown cycle was begun and completed. The fluid within the balloon was withdrawn and the ThermaChoice instrument removed from the endometrial cavity.  All instruments were then removed from the vagina and the patient was awakened from general anesthesia and taken to the recovery room in satisfactory condition having tolerated the procedure well sponge and instrument counts correct.  The patient had been treated with Toradol 30 mg IV and 30 mg IM prior to beginning the endometrial ablation procedure.  PLAN OF CARE: The patient will be discharged home after stabilization in the PACU  PATIENT DISPOSITION:  PACU   Delay start of Pharmacological VTE agent (>24hrs) due to surgical blood loss or risk of bleeding:  not applicable patient used SCDs during the procedure   Haley Morales, MD 1:34 PM

## 2012-06-30 NOTE — H&P (Signed)
Haley Weber is an 47 y.o. female. Presents for management of menorrhagia, and an appearance endometrial mass. The patient has a known history of uterine fibroids in 2012 complaining of menorrhagia, but did not follow up with recommendations. In February of 2014 the patient was symptomatically anemic and was transfused at [facility]. She is adamant that she does not want hysterectomy, but presents today for resection of the endometrial mass and a possible endometrial ablation  Pertinent Gynecological History: Menses: flow is excessive with use of 12 pads or tampons on heaviest days Bleeding: Heavy during menses Contraception: tubal ligation DES exposure: unknown Blood transfusions: February 2014   Previous GYN Procedures: Tubal ligation  Last pap: normal Date: 06/03/2012 OB History: G2, P2   Menstrual History: Patient's last menstrual period was 06/11/2012.    Past Medical History  Diagnosis Date  . SVD (spontaneous vaginal delivery)     x 2  . Anxiety     no meds  . Asthma     rarely used inhaler  . Anemia   . History of blood transfusion 02/25/12    At [facility], 2 units transfused    Past Surgical History  Procedure Laterality Date  . Left acl repair    . Tubal ligation      History reviewed. No pertinent family history.  Social History:  has no tobacco, alcohol, and drug history on file.  Allergies: No Known Allergies  Prescriptions prior to admission  Medication Sig Dispense Refill  . ibuprofen (ADVIL,MOTRIN) 200 MG tablet Take 200 mg by mouth every 4 (four) hours as needed for pain.        Review of Systems  Constitutional: Negative.   HENT: Negative.   Eyes: Negative.   Respiratory: Negative.   Cardiovascular: Negative.   Gastrointestinal: Negative.   Genitourinary: Negative.   Musculoskeletal: Negative.   Skin: Negative.   Neurological: Negative.   Endo/Heme/Allergies: Negative.   Psychiatric/Behavioral: Negative.     Blood pressure  130/74, pulse 79, temperature 99.1 F (37.3 C), temperature source Oral, resp. rate 20, height 5\' 1"  (1.549 m), weight 235 lb (106.595 kg), last menstrual period 06/11/2012, SpO2 100.00%. Physical Exam  Constitutional: She is oriented to person, place, and time.  Obese  HENT:  Head: Normocephalic and atraumatic.  Eyes: Conjunctivae and EOM are normal.  Neck: Normal range of motion. Neck supple.  Cardiovascular: Normal rate and regular rhythm.   Respiratory: Effort normal and breath sounds normal.  GI: Soft. Bowel sounds are normal.  Musculoskeletal: Normal range of motion.  Neurological: She is alert and oriented to person, place, and time.  Skin: Skin is warm and dry.  Psychiatric: She has a normal mood and affect.   Pelvic exam:VULVA: normal appearing vulva with no masses, tenderness or lesions,  VAGINA: normal appearing vagina with normal color and discharge, no lesions,  CERVIX: normal appearing cervix without discharge or lesions,  UTERUS: enlarged but extent difficult to determine,  ADNEXA: normal adnexa in size, nontender and no masses, no masses, exam limited by body habitus . Sonohysterogram done on 06/03/2012 showed an endometrial mass measuring approximately 3.3 cm and almost completely filling the endometrial cavity most consistent with a submucosal fibroid. Results for orders placed during the hospital encounter of 06/30/12 (from the past 24 hour(s))  PREGNANCY, URINE     Status: None   Collection Time    06/30/12 10:21 AM      Result Value Range   Preg Test, Ur NEGATIVE  NEGATIVE  CBC  Status: Abnormal   Collection Time    06/30/12 10:35 AM      Result Value Range   WBC 9.3  4.0 - 10.5 K/uL   RBC 5.03  3.87 - 5.11 MIL/uL   Hemoglobin 8.0 (*) 12.0 - 15.0 g/dL   HCT 78.2 (*) 95.6 - 21.3 %   MCV 58.6 (*) 78.0 - 100.0 fL   MCH 15.9 (*) 26.0 - 34.0 pg   MCHC 27.1 (*) 30.0 - 36.0 g/dL   RDW 08.6 (*) 57.8 - 46.9 %   Platelets 399  150 - 400 K/uL      Assessment/Plan: Symptomatic menorrhagia requiring transfusion in February 2014 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 The risks of anesthesia, bleeding, infection, damage to adjacent organs have been explained. The particular risk of uterine perforation has likewise been reviewed. The patient understands that uterine perforation occurs the procedure. Will need to be terminated and no endometrial ablation can be performed. She likewise understands that if the mass should contain abnormal cells she may require subsequent surgery  Mattia Liford P 06/30/2012, 12:15 PM

## 2012-06-30 NOTE — Anesthesia Preprocedure Evaluation (Signed)
Anesthesia Evaluation  Patient identified by MRN, date of birth, ID band Patient awake    Reviewed: Allergy & Precautions, H&P , NPO status , Patient's Chart, lab work & pertinent test results, reviewed documented beta blocker date and time   History of Anesthesia Complications Negative for: history of anesthetic complications  Airway Mallampati: I TM Distance: >3 FB Neck ROM: full    Dental  (+) Teeth Intact   Pulmonary asthma (no inhaler in 4-5 years) ,  breath sounds clear to auscultation  Pulmonary exam normal       Cardiovascular Exercise Tolerance: Good negative cardio ROS  Rhythm:regular Rate:Normal + Systolic murmurs    Neuro/Psych negative neurological ROS  negative psych ROS   GI/Hepatic negative GI ROS, Neg liver ROS,   Endo/Other  Morbid obesity  Renal/GU negative Renal ROS  Female GU complaint     Musculoskeletal   Abdominal   Peds  Hematology  (+) Blood dyscrasia (h/o blood transfusion 1/14, anemia from menstrual bleeding), anemia ,   Anesthesia Other Findings   Reproductive/Obstetrics negative OB ROS                           Anesthesia Physical Anesthesia Plan  ASA: III  Anesthesia Plan: General LMA   Post-op Pain Management:    Induction:   Airway Management Planned:   Additional Equipment:   Intra-op Plan:   Post-operative Plan:   Informed Consent: I have reviewed the patients History and Physical, chart, labs and discussed the procedure including the risks, benefits and alternatives for the proposed anesthesia with the patient or authorized representative who has indicated his/her understanding and acceptance.   Dental Advisory Given  Plan Discussed with: CRNA and Surgeon  Anesthesia Plan Comments:         Anesthesia Quick Evaluation

## 2012-06-30 NOTE — Transfer of Care (Signed)
Immediate Anesthesia Transfer of Care Note  Patient: Haley Weber  Procedure(s) Performed: Procedure(s): DILATATION & CURETTAGE/HYSTEROSCOPY WITH thermachoice (N/A)  Patient Location: PACU  Anesthesia Type:General  Level of Consciousness: awake, alert  and oriented  Airway & Oxygen Therapy: Patient Spontanous Breathing and Patient connected to nasal cannula oxygen  Post-op Assessment: Report given to PACU RN, Post -op Vital signs reviewed and stable and Patient moving all extremities  Post vital signs: Reviewed and stable  Complications: No apparent anesthesia complications

## 2012-06-30 NOTE — Preoperative (Signed)
Beta Blockers   Reason not to administer Beta Blockers:Not Applicable 

## 2012-07-01 ENCOUNTER — Encounter (HOSPITAL_COMMUNITY): Payer: Self-pay | Admitting: Obstetrics and Gynecology

## 2014-12-26 ENCOUNTER — Inpatient Hospital Stay (HOSPITAL_COMMUNITY)
Admission: AD | Admit: 2014-12-26 | Discharge: 2014-12-26 | Disposition: A | Discharge status: Home / Self Care | Payer: Commercial Managed Care - HMO | Source: Ambulatory Visit | Attending: Family Medicine | Admitting: Family Medicine

## 2014-12-26 ENCOUNTER — Encounter (HOSPITAL_COMMUNITY): Payer: Self-pay | Admitting: *Deleted

## 2014-12-26 DIAGNOSIS — N946 Dysmenorrhea, unspecified: Secondary | ICD-10-CM

## 2014-12-26 DIAGNOSIS — N939 Abnormal uterine and vaginal bleeding, unspecified: Secondary | ICD-10-CM | POA: Insufficient documentation

## 2014-12-26 LAB — CBC
HCT: 33.3 % — ABNORMAL LOW (ref 36.0–46.0)
Hemoglobin: 10.3 g/dL — ABNORMAL LOW (ref 12.0–15.0)
MCH: 22.7 pg — AB (ref 26.0–34.0)
MCHC: 30.9 g/dL (ref 30.0–36.0)
MCV: 73.3 fL — ABNORMAL LOW (ref 78.0–100.0)
PLATELETS: 411 10*3/uL — AB (ref 150–400)
RBC: 4.54 MIL/uL (ref 3.87–5.11)
RDW: 16 % — AB (ref 11.5–15.5)
WBC: 10.6 10*3/uL — AB (ref 4.0–10.5)

## 2014-12-26 LAB — POCT PREGNANCY, URINE: PREG TEST UR: NEGATIVE

## 2014-12-26 MED ORDER — MEGESTROL ACETATE 20 MG PO TABS: 40.0000 mg | ORAL_TABLET | Freq: Two times a day (BID) | ORAL | Status: DC

## 2014-12-26 NOTE — MAU Note (Signed)
Patient endorses vaginal bleeding since nov 8th; states this is her regular period but is "flowing heavier". Endorse going through 2 pads/hour; lower abdominal pain--5/10. No bleeding noted in triage as patient just went to restroom.

## 2014-12-26 NOTE — Discharge Instructions (Signed)
Abnormal Uterine Bleeding Abnormal uterine bleeding means bleeding from the vagina that is not your normal menstrual period. This can be:  Bleeding or spotting between periods.  Bleeding after sex (sexual intercourse).  Bleeding that is heavier or more than normal.  Periods that last longer than usual.  Bleeding after menopause. There are many problems that may cause this. Treatment will depend on the cause of the bleeding. Any kind of bleeding that is not normal should be reviewed by your doctor.  HOME CARE Watch your condition for any changes. These actions may lessen any discomfort you are having:  Do not use tampons or douches as told by your doctor.  Change your pads often. You should get regular pelvic exams and Pap tests. Keep all appointments for tests as told by your doctor. GET HELP IF:  You are bleeding for more than 1 week.  You feel dizzy at times. GET HELP RIGHT AWAY IF:   You pass out.  You have to change pads every 15 to 30 minutes.  You have belly pain.  You have a fever.  You become sweaty or weak.  You are passing large blood clots from the vagina.  You feel sick to your stomach (nauseous) and throw up (vomit). MAKE SURE YOU:  Understand these instructions.  Will watch your condition.  Will get help right away if you are not doing well or get worse.   This information is not intended to replace advice given to you by your health care provider. Make sure you discuss any questions you have with your health care provider.   Document Released: 11/11/2008 Document Revised: 01/19/2013 Document Reviewed: 08/13/2012 Elsevier Interactive Patient Education 2016 Elsevier Inc[employer]  Dysmenorrhea Menstrual cramps (dysmenorrhea) are caused by the muscles of the uterus tightening (contracting) during a menstrual period. For some women, this discomfort is merely bothersome. For others, dysmenorrhea can be severe enough to interfere with everyday activities for a  few days each month. Primary dysmenorrhea is menstrual cramps that last a couple of days when you start having menstrual periods or soon after. This often begins after a teenager starts having her period. As a woman gets older or has a baby, the cramps will usually lessen or disappear. Secondary dysmenorrhea begins later in life, lasts longer, and the pain may be stronger than primary dysmenorrhea. The pain may start before the period and last a few days after the period.  CAUSES  Dysmenorrhea is usually caused by an underlying problem, such as:  The tissue lining the uterus grows outside of the uterus in other areas of the body (endometriosis).  The endometrial tissue, which normally lines the uterus, is found in or grows into the muscular walls of the uterus (adenomyosis).  The pelvic blood vessels are engorged with blood just before the menstrual period (pelvic congestive syndrome).  Overgrowth of cells (polyps) in the lining of the uterus or cervix.  Falling down of the uterus (prolapse) because of loose or stretched ligaments.  Depression.  Bladder problems, infection, or inflammation.  Problems with the intestine, a tumor, or irritable bowel syndrome.  Cancer of the female organs or bladder.  A severely tipped uterus.  A very tight opening or closed cervix.  Noncancerous tumors of the uterus (fibroids).  Pelvic inflammatory disease (PID).  Pelvic scarring (adhesions) from a previous surgery.  Ovarian cyst.  An intrauterine device (IUD) used for birth control. RISK FACTORS You may be at greater risk of dysmenorrhea if:  You are younger than age  25.  You started puberty early.  You have irregular or heavy bleeding.  You have never given birth.  You have a family history of this problem.  You are a smoker. SIGNS AND SYMPTOMS   Cramping or throbbing pain in your lower abdomen.  Headaches.  Lower back pain.  Nausea or vomiting.  Diarrhea.  Sweating or  dizziness.  Loose stools. DIAGNOSIS  A diagnosis is based on your history, symptoms, physical exam, diagnostic tests, or procedures. Diagnostic tests or procedures may include:  Blood tests.  Ultrasonography.  An examination of the lining of the uterus (dilation and curettage, D&C).  An examination inside your abdomen or pelvis with a scope (laparoscopy).  X-rays.  CT scan.  MRI.  An examination inside the bladder with a scope (cystoscopy).  An examination inside the intestine or stomach with a scope (colonoscopy, gastroscopy). TREATMENT  Treatment depends on the cause of the dysmenorrhea. Treatment may include:  Pain medicine prescribed by your health care provider.  Birth control pills or an IUD with progesterone hormone in it.  Hormone replacement therapy.  Nonsteroidal anti-inflammatory drugs (NSAIDs). These may help stop the production of prostaglandins.  Surgery to remove adhesions, endometriosis, ovarian cyst, or fibroids.  Removal of the uterus (hysterectomy).  Progesterone shots to stop the menstrual period.  Cutting the nerves on the sacrum that go to the female organs (presacral neurectomy).  Electric current to the sacral nerves (sacral nerve stimulation).  Antidepressant medicine.  Psychiatric therapy, counseling, or group therapy.  Exercise and physical therapy.  Meditation and yoga therapy.  Acupuncture. HOME CARE INSTRUCTIONS   Only take over-the-counter or prescription medicines as directed by your health care provider.  Place a heating pad or hot water bottle on your lower back or abdomen. Do not sleep with the heating pad.  Use aerobic exercises, walking, swimming, biking, and other exercises to help lessen the cramping.  Massage to the lower back or abdomen may help.  Stop smoking.  Avoid alcohol and caffeine. SEEK MEDICAL CARE IF:   Your pain does not get better with medicine.  You have pain with sexual intercourse.  Your  pain increases and is not controlled with medicines.  You have abnormal vaginal bleeding with your period.  You develop nausea or vomiting with your period that is not controlled with medicine. SEEK IMMEDIATE MEDICAL CARE IF:  You pass out.    This information is not intended to replace advice given to you by your health care provider. Make sure you discuss any questions you have with your health care provider.   Document Released: 01/14/2005 Document Revised: 09/16/2012 Document Reviewed: 07/02/2012 Elsevier Interactive Patient Education [employer].

## 2014-12-26 NOTE — MAU Provider Note (Signed)
History     CSN: [id number]  Arrival date and time: 12/26/14 1859   First Provider Initiated Contact with Patient 12/26/14 1933      Chief Complaint  Patient presents with  . Vaginal Bleeding   HPI  Ms. Haley Weber is a 49 y.o. who presents to MAU today with complaint of vaginal bleeding since 12/06/14. The patient states that bleeding has been heavy. She is using 2 pads/hour today. She states lower abdominal pain associated with heavy bleeding. She rates pain at 7/10 now. She is taking Motrin with some relief. She states that she had hysteroscopy in 2014 and had not had heavy bleeding like this since then. She states a continued issue with irregular periods, stating that she is "peri-menopausal" however she denies any episodes of bleeding this prolonged or heavy. She does endorse mild weakness, but denies dizziness or LOC.   OB History    No data available      Past Medical History  Diagnosis Date  . SVD (spontaneous vaginal delivery)     x 2  . Anxiety     no meds  . Asthma     rarely used inhaler  . Anemia   . History of blood transfusion 02/25/12    At [facility], 2 units transfused    Past Surgical History  Procedure Laterality Date  . Left acl repair    . Tubal ligation    . Dilitation & currettage/hystroscopy with versapoint resection N/A 06/30/2012    Procedure: DILATATION & CURETTAGE/HYSTEROSCOPY WITH thermachoice;  Surgeon: Eldred Manges, MD;  Location: [facility];  Service: Gynecology;  Laterality: N/A;    History reviewed. No pertinent family history.  Social History  Substance Use Topics  . Smoking status: None  . Smokeless tobacco: None  . Alcohol Use: None    Allergies:  Allergies  Allergen Reactions  . Bactrim [Sulfamethoxazole-Trimethoprim] Hives    Prescriptions prior to admission  Medication Sig Dispense Refill Last Dose  . acetaminophen (TYLENOL) 325 MG tablet Take 325 mg by mouth every 6 (six) hours as needed (cramping).    12/26/2014 at Unknown time  . ibuprofen (ADVIL,MOTRIN) 200 MG tablet Take 200 mg by mouth every hour as needed for cramping.   12/26/2014 at Unknown time  . Ibuprofen (MIDOL) 200 MG CAPS Take 2 capsules by mouth every 2 (two) hours as needed (cramping).   12/26/2014 at Unknown time  . Multiple Vitamins-Minerals (MULTIVITAMIN PO) Take 1 tablet by mouth daily.   12/26/2014 at Unknown time  . HYDROcodone-acetaminophen (NORCO/VICODIN) 5-325 MG per tablet Take 1 tablet by mouth every 6 (six) hours as needed for pain. (Patient not taking: Reported on 12/26/2014) 30 tablet 0 Not Taking at Unknown time  . ibuprofen (ADVIL,MOTRIN) 600 MG tablet Take 1 tablet (600 mg total) by mouth every 6 (six) hours. (Patient not taking: Reported on 12/26/2014) 60 tablet 2   . Iron-FA-B Cmp-C-Biot-Probiotic (FUSION PLUS) CAPS Take 1 capsule by mouth 2 (two) times daily. (Patient not taking: Reported on 12/26/2014) 60 capsule 3     Review of Systems  Constitutional: Negative for fever and malaise/fatigue.  Gastrointestinal: Positive for abdominal pain. Negative for nausea, vomiting, diarrhea and constipation.  Genitourinary:       + vaginal bleeding   Physical Exam   Blood pressure 138/73, pulse 80, temperature 98.1 F (36.7 C), temperature source Oral, resp. rate 18, weight 274 lb 3.2 oz (124.376 kg), last menstrual period 12/06/2014, SpO2 100 %.  Physical Exam  Nursing note and vitals reviewed. Constitutional: She is oriented to person, place, and time. She appears well-developed and well-nourished. No distress.  HENT:  Head: Normocephalic and atraumatic.  Cardiovascular: Normal rate.   Respiratory: Effort normal.  GI: Soft. She exhibits no distension and no mass. There is no tenderness. There is no rebound and no guarding.  Genitourinary: Uterus is not enlarged (exam limited by patient body habitus) and not tender. Cervix exhibits no motion tenderness, no discharge and no friability. Right adnexum displays no  mass and no tenderness. Left adnexum displays no mass and no tenderness. There is bleeding (small amount of blood in the vaginal vault) in the vagina. No vaginal discharge found.  Neurological: She is alert and oriented to person, place, and time.  Skin: Skin is warm and dry. No erythema.  Psychiatric: She has a normal mood and affect.   Results for orders placed or performed during the hospital encounter of 12/26/14 (from the past 24 hour(s))  CBC     Status: Abnormal   Collection Time: 12/26/14  7:43 PM  Result Value Ref Range   WBC 10.6 (H) 4.0 - 10.5 K/uL   RBC 4.54 3.87 - 5.11 MIL/uL   Hemoglobin 10.3 (L) 12.0 - 15.0 g/dL   HCT 33.3 (L) 36.0 - 46.0 %   MCV 73.3 (L) 78.0 - 100.0 fL   MCH 22.7 (L) 26.0 - 34.0 pg   MCHC 30.9 30.0 - 36.0 g/dL   RDW 16.0 (H) 11.5 - 15.5 %   Platelets 411 (H) 150 - 400 K/uL  Pregnancy, urine POC     Status: None   Collection Time: 12/26/14  8:02 PM  Result Value Ref Range   Preg Test, Ur NEGATIVE NEGATIVE    MAU Course  Procedures None  MDM UPT - negative CBC today Patient is hemodynamically stable. Minimal bleeding on exam today. Patient will complete work-up for AUB as outpatient.   Assessment and Plan  A: AUB Dysmenorrhea  P: Discharge home Rx for Megace given to patient Bleeding precautions discussed Outpatient [place] ordered. They will call patient with results Patient advised to follow-up with [facility] for [place] results and management in 1-2 weeks. They will call with appointment date/time Patient may return to MAU as needed or if her condition were to change or worsen   Luvenia Redden, PA-C  12/26/2014, 8:08 PM

## 2014-12-29 ENCOUNTER — Ambulatory Visit (HOSPITAL_COMMUNITY)
Admission: RE | Admit: 2014-12-29 | Discharge: 2014-12-29 | Disposition: A | Discharge status: Home / Self Care | Payer: Commercial Managed Care - HMO | Source: Ambulatory Visit | Attending: Medical | Admitting: Medical

## 2014-12-29 DIAGNOSIS — N939 Abnormal uterine and vaginal bleeding, unspecified: Secondary | ICD-10-CM

## 2014-12-29 DIAGNOSIS — N852 Hypertrophy of uterus: Secondary | ICD-10-CM | POA: Insufficient documentation

## 2014-12-29 DIAGNOSIS — N946 Dysmenorrhea, unspecified: Secondary | ICD-10-CM

## 2014-12-29 DIAGNOSIS — D259 Leiomyoma of uterus, unspecified: Secondary | ICD-10-CM | POA: Diagnosis not present

## 2015-01-19 ENCOUNTER — Ambulatory Visit: Payer: Managed Care, Other (non HMO) | Admitting: Obstetrics and Gynecology

## 2015-02-15 ENCOUNTER — Ambulatory Visit: Payer: Managed Care, Other (non HMO) | Admitting: Obstetrics and Gynecology

## 2017-04-19 IMAGING — US US PELVIS COMPLETE
1 series · 15 of 25 positions shown · non-contrast
Comparison: 01/17/1999

CLINICAL DATA: Bleeding since the 12/06/2014. Abnormal uterine
bleeding, dysmenorrhea. Pain prior D&C and hysteroscopy, tubal
ligation.

EXAM:
TRANSABDOMINAL AND TRANSVAGINAL ULTRASOUND OF PELVIS
TECHNIQUE: Both transabdominal and transvaginal ultrasound examinations of the
pelvis were performed. Transabdominal technique was performed for
global imaging of the pelvis including uterus, ovaries, adnexal
regions, and pelvic cul-de-sac. It was necessary to proceed with
endovaginal exam following the transabdominal exam to visualize the
endometrium and ovaries.

[Series 1: us pelvis complete · 15 of 100 slices shown]
[im 1/100]
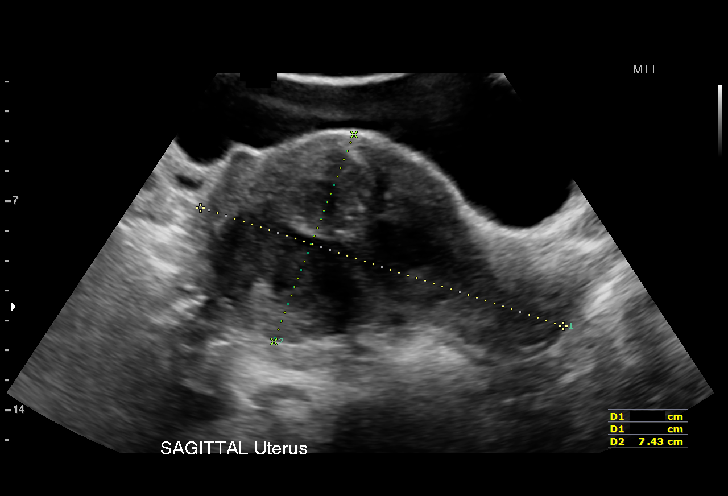
[im 9/100]
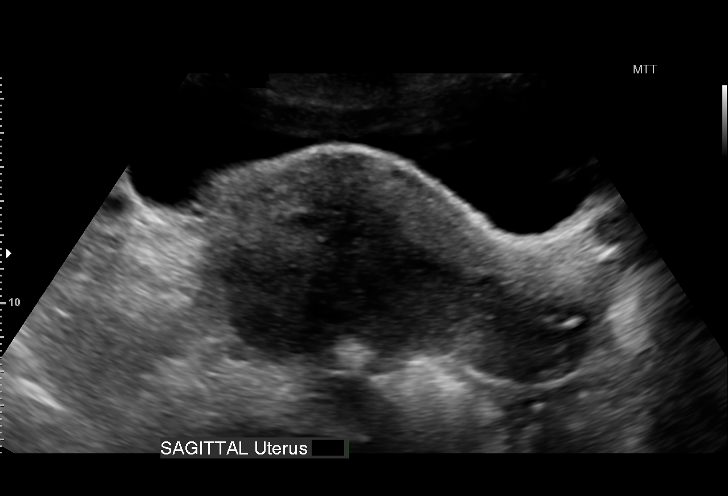
[im 17/100]
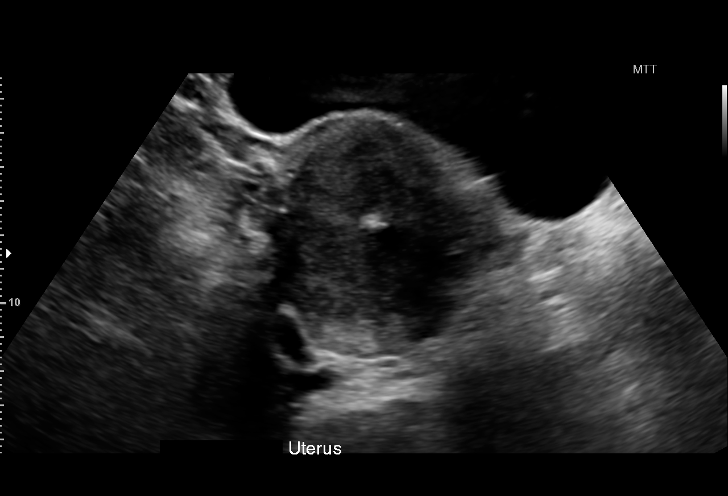
[im 21/100]
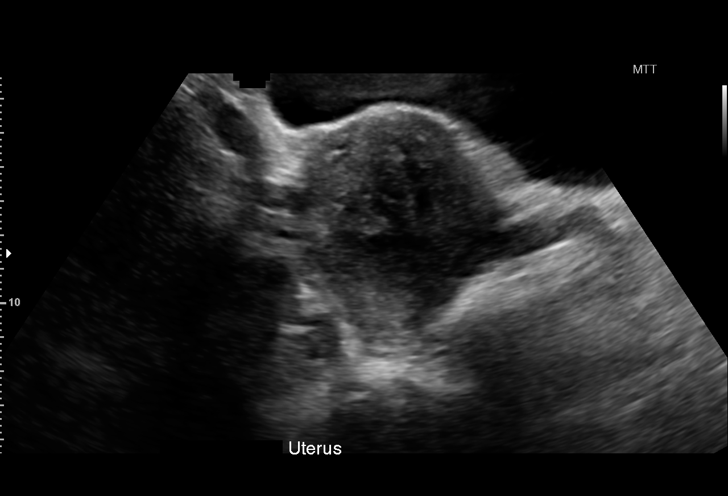
[im 29/100]
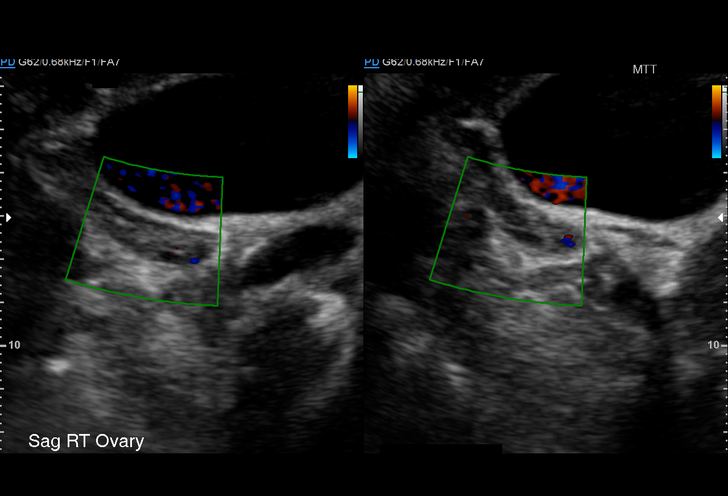
[im 38/100]
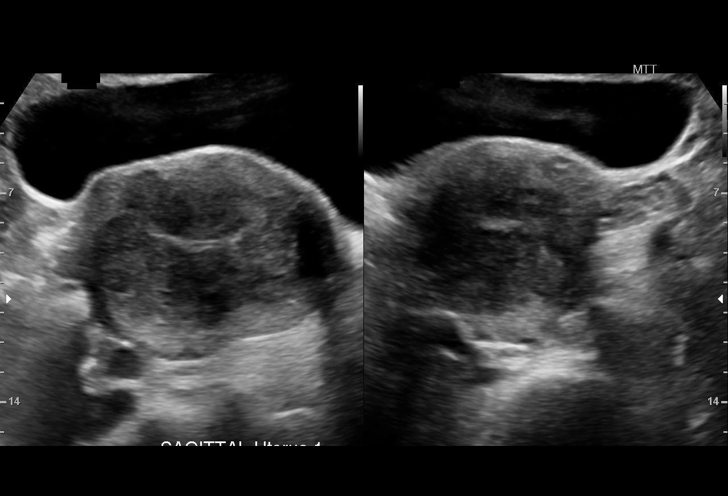
[im 42/100]
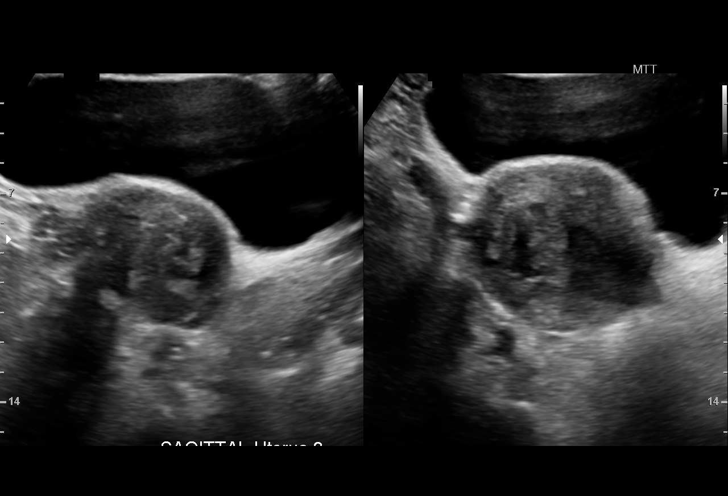
[im 50/100]
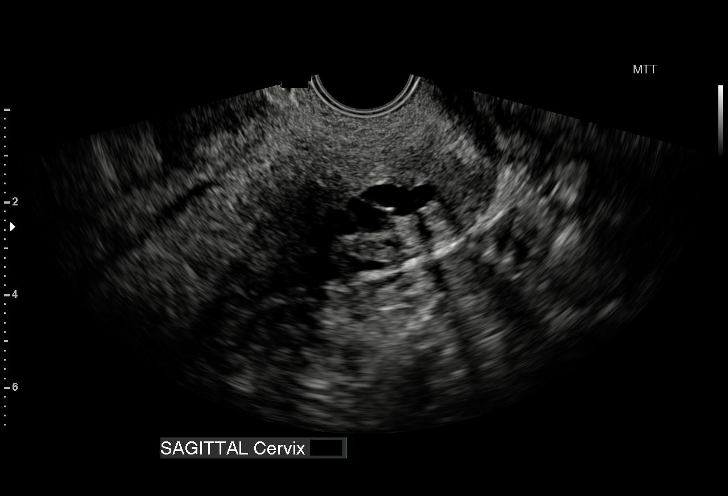
[im 58/100]
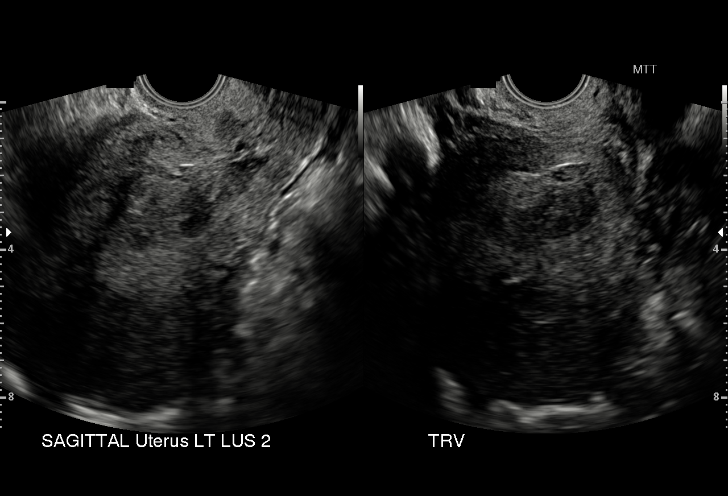
[im 62/100]
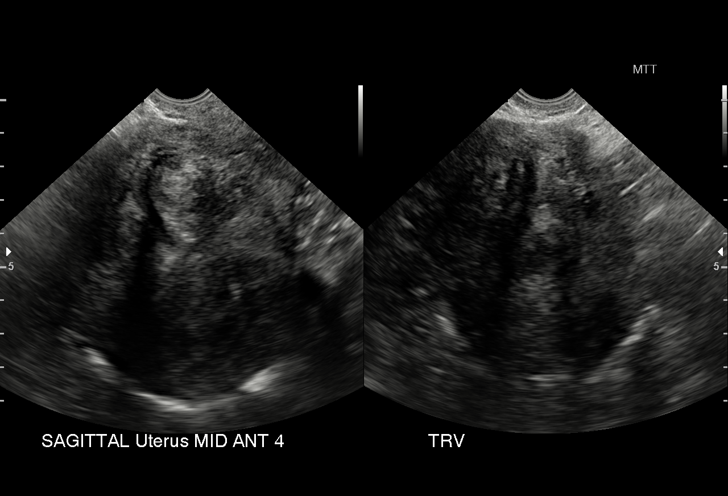
[im 71/100]
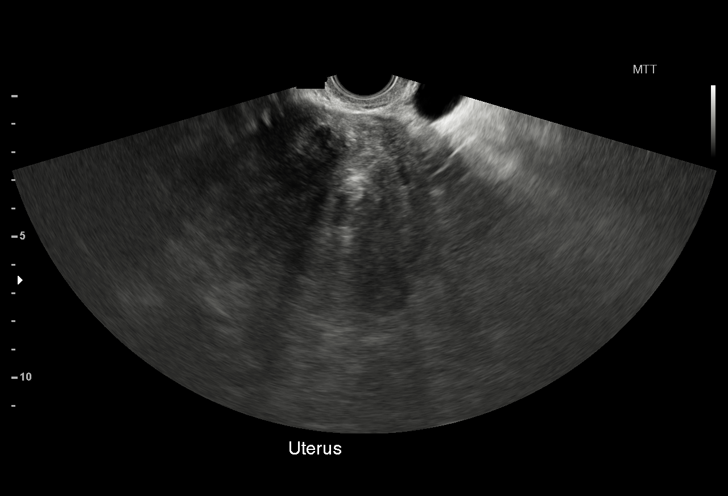
[im 79/100]
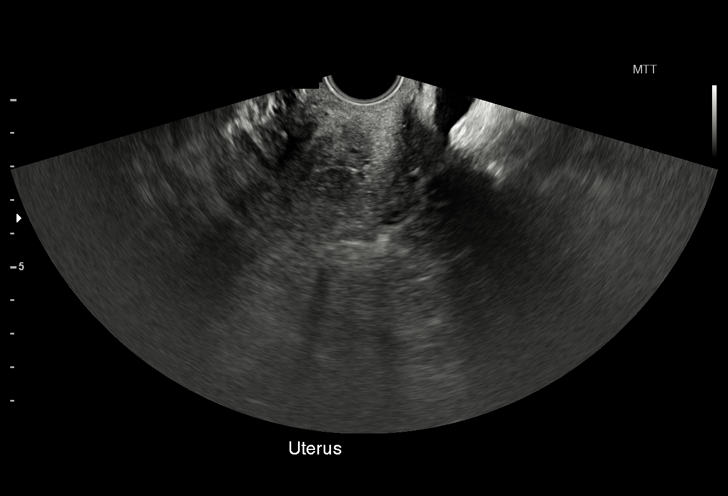
[im 83/100]
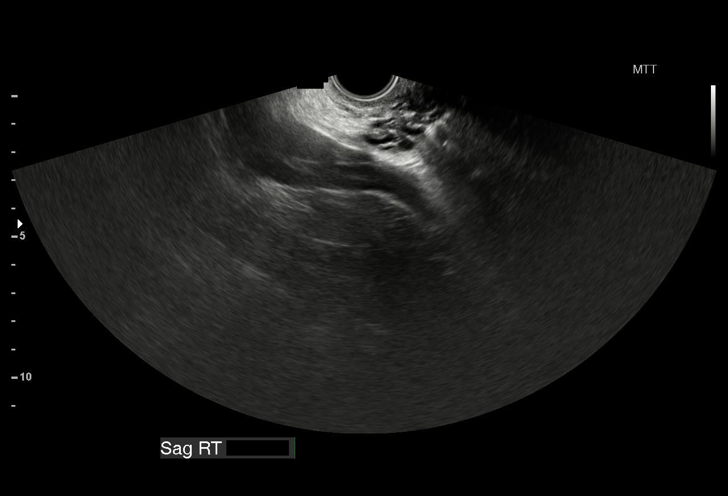
[im 91/100]
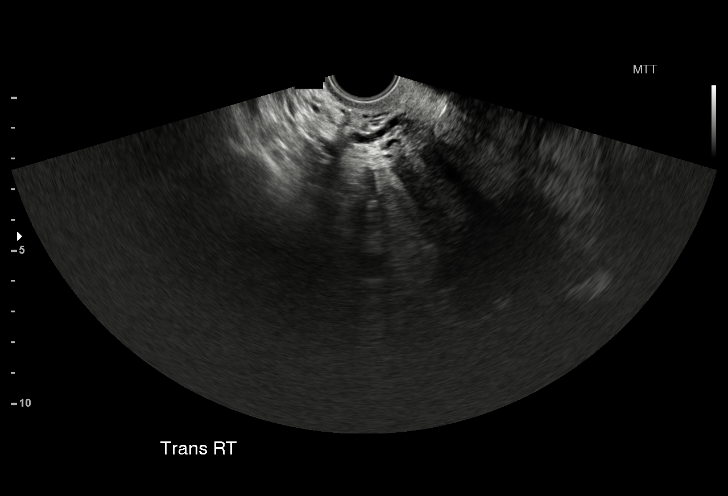
[im 100/100]
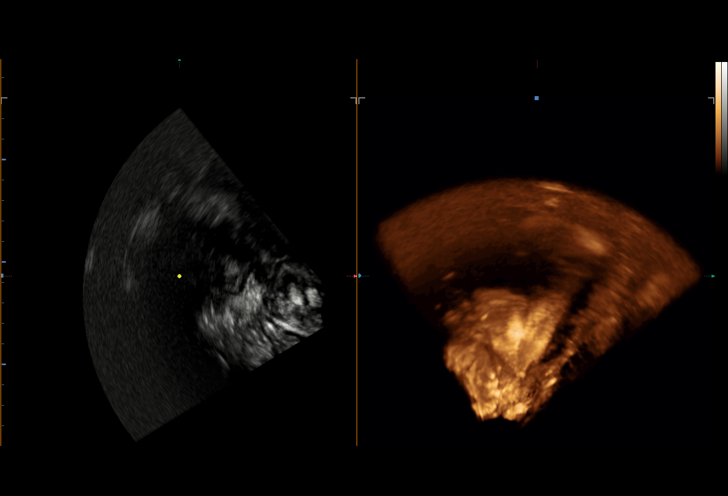

[15 of 25 positions shown; findings below may reference images not displayed]

FINDINGS: Uterus

Measurements: At least 12.7 x 7.4 x 7.4 cm. Multiple fibroids are
present.

Posterior fundal fibroid is 5.1 x 2.9 x 5.1 cm.

Anterior myometrial fibroid is 3.2 x 2.0 x 3.0 cm.

Right fibroid is 2.9 x 3.5 x 2.8 cm.

Lower central fibroid is 2.8 x 2.4 x 2.5 cm.

Fundal fibroid is 2.1 x 1.3 x 1.8 cm.

Endometrium

Thickness: 13 mm. Endometrium is difficult to visualize because of
multiple fibroids.

Right ovary

Measurements: 2.3 x 0.9 x 2.3 cm. Normal appearance/no adnexal mass.

Left ovary

Measurements: 2.4 x 1.4 x 3.0 cm. Normal appearance/no adnexal mass.

Other findings

No free fluid.
IMPRESSION: 1. Enlarged uterus containing multiple fibroids.
2. Endometrium is difficult to evaluate but estimated to be 13 mm in
thickness. If bleeding remains unresponsive to hormonal or medical
therapy, sonohysterogram should be considered for focal lesion
work-up. (Ref: Radiological Reasoning: Algorithmic Workup of
Abnormal Vaginal Bleeding with Endovaginal Sonography and
Sonohysterography. AJR [id number]; [id number])
3. Normal appearance of the ovaries.

## 2017-08-31 ENCOUNTER — Encounter (HOSPITAL_COMMUNITY): Payer: Self-pay

## 2017-08-31 ENCOUNTER — Other Ambulatory Visit: Payer: Self-pay

## 2017-08-31 ENCOUNTER — Inpatient Hospital Stay (HOSPITAL_COMMUNITY)
Admission: AD | Admit: 2017-08-31 | Discharge: 2017-08-31 | Disposition: A | Discharge status: Home / Self Care | Payer: 59 | Source: Ambulatory Visit | Attending: Obstetrics & Gynecology | Admitting: Obstetrics & Gynecology

## 2017-08-31 DIAGNOSIS — J45909 Unspecified asthma, uncomplicated: Secondary | ICD-10-CM | POA: Insufficient documentation

## 2017-08-31 DIAGNOSIS — Z79899 Other long term (current) drug therapy: Secondary | ICD-10-CM | POA: Insufficient documentation

## 2017-08-31 DIAGNOSIS — Z882 Allergy status to sulfonamides status: Secondary | ICD-10-CM | POA: Insufficient documentation

## 2017-08-31 DIAGNOSIS — Z79891 Long term (current) use of opiate analgesic: Secondary | ICD-10-CM | POA: Diagnosis not present

## 2017-08-31 DIAGNOSIS — D259 Leiomyoma of uterus, unspecified: Secondary | ICD-10-CM | POA: Diagnosis not present

## 2017-08-31 DIAGNOSIS — N920 Excessive and frequent menstruation with regular cycle: Secondary | ICD-10-CM | POA: Diagnosis not present

## 2017-08-31 DIAGNOSIS — Z79818 Long term (current) use of other agents affecting estrogen receptors and estrogen levels: Secondary | ICD-10-CM | POA: Diagnosis not present

## 2017-08-31 DIAGNOSIS — N939 Abnormal uterine and vaginal bleeding, unspecified: Secondary | ICD-10-CM | POA: Diagnosis present

## 2017-08-31 LAB — URINALYSIS, ROUTINE W REFLEX MICROSCOPIC
BACTERIA UA: NONE SEEN
BILIRUBIN URINE: NEGATIVE
GLUCOSE, UA: NEGATIVE mg/dL
Ketones, ur: NEGATIVE mg/dL
LEUKOCYTES UA: NEGATIVE
NITRITE: NEGATIVE
PROTEIN: 30 mg/dL — AB
SPECIFIC GRAVITY, URINE: 1.021 (ref 1.005–1.030)
pH: 5 (ref 5.0–8.0)

## 2017-08-31 LAB — CBC
HCT: 36.2 % (ref 36.0–46.0)
Hemoglobin: 11.4 g/dL — ABNORMAL LOW (ref 12.0–15.0)
MCH: 24.5 pg — ABNORMAL LOW (ref 26.0–34.0)
MCHC: 31.5 g/dL (ref 30.0–36.0)
MCV: 77.7 fL — ABNORMAL LOW (ref 78.0–100.0)
Platelets: 353 10*3/uL (ref 150–400)
RBC: 4.66 MIL/uL (ref 3.87–5.11)
RDW: 15.4 % (ref 11.5–15.5)
WBC: 8.3 10*3/uL (ref 4.0–10.5)

## 2017-08-31 LAB — WET PREP, GENITAL
Clue Cells Wet Prep HPF POC: NONE SEEN
Sperm: NONE SEEN
TRICH WET PREP: NONE SEEN
YEAST WET PREP: NONE SEEN

## 2017-08-31 LAB — TYPE AND SCREEN
ABO/RH(D): O NEG
Antibody Screen: NEGATIVE

## 2017-08-31 LAB — POCT PREGNANCY, URINE: PREG TEST UR: NEGATIVE

## 2017-08-31 LAB — ABO/RH: ABO/RH(D): O NEG

## 2017-08-31 MED ORDER — IBUPROFEN 200 MG PO TABS: 600.0000 mg | ORAL_TABLET | Freq: Four times a day (QID) | ORAL | 0 refills | Status: AC | PRN

## 2017-08-31 MED ORDER — MEDROXYPROGESTERONE ACETATE 10 MG PO TABS: 20.0000 mg | ORAL_TABLET | ORAL | 1 refills | Status: DC

## 2017-08-31 NOTE — Discharge Instructions (Signed)
Abnormal Uterine Bleeding Abnormal uterine bleeding can affect women at various stages in life, including teenagers, women in their reproductive years, pregnant women, and women who have reached menopause. Several kinds of uterine bleeding are considered abnormal, including:  Bleeding or spotting between periods.  Bleeding after sexual intercourse.  Bleeding that is heavier or more than normal.  Periods that last longer than usual.  Bleeding after menopause. Many cases of abnormal uterine bleeding are minor and simple to treat, while others are more serious. Any type of abnormal bleeding should be evaluated by your health care provider. Treatment will depend on the cause of the bleeding. Follow these instructions at home: Monitor your condition for any changes. The following actions may help to alleviate any discomfort you are experiencing:  Avoid the use of tampons and douches as directed by your health care provider.  Change your pads frequently. You should get regular pelvic exams and Pap tests. Keep all follow-up appointments for diagnostic tests as directed by your health care provider. Contact a health care provider if:  Your bleeding lasts more than 1 week.  You feel dizzy at times. Get help right away if:  You pass out.  You are changing pads every 15 to 30 minutes.  You have abdominal pain.  You have a fever.  You become sweaty or weak.  You are passing large blood clots from the vagina.  You start to feel nauseous and vomit. This information is not intended to replace advice given to you by your health care provider. Make sure you discuss any questions you have with your health care provider. Document Released: 01/14/2005 Document Revised: 06/28/2015 Document Reviewed: 08/13/2012 Elsevier Interactive Patient Education  2017 Elsevier Inc.  

## 2017-08-31 NOTE — MAU Provider Note (Signed)
Chief Complaint: Vaginal Bleeding and Abdominal Pain   First Provider Initiated Contact with Patient 08/31/17 1652      SUBJECTIVE HPI: Haley Weber is a 52 y.o. G2P2002 female who presents to Maternity Admissions reporting heavy vaginal bleeding and passing clots x 2 weeks that is worsening. Hx AUB and ablation in 2014. Hx multiple fibroids. Has has a blood transfusion for AUB. Unsure of what she has been Tx'd w/ in the past. Rx'd Megace at last MAU visit and said it didn't work. States she had an endometrial Bx ~2 years ago for AUB performed by her PCP. (Not found in Epic or Care Everywhere)  Duration: 2 weeks Associated signs and symptoms: Mild cramping, dizziness. Neg for fever, chills, syncope, vaginal discharge.  Past Medical History:  Diagnosis Date  . Anemia   . Anxiety    no meds  . Asthma    rarely used inhaler  . History of blood transfusion 02/25/12   At [facility], 2 units transfused  . SVD (spontaneous vaginal delivery)    x 2   OB History  Gravida Para Term Preterm AB Living  2 2 2     2   SAB TAB Ectopic Multiple Live Births               # Outcome Date GA Lbr Len/2nd Weight Sex Delivery Anes PTL Lv  2 Term           1 Term            Past Surgical History:  Procedure Laterality Date  . DILITATION & CURRETTAGE/HYSTROSCOPY WITH VERSAPOINT RESECTION N/A 06/30/2012   Procedure: DILATATION & CURETTAGE/HYSTEROSCOPY WITH thermachoice;  Surgeon: Eldred Manges, MD;  Location: [facility];  Service: Gynecology;  Laterality: N/A;  . left acl repair    . TUBAL LIGATION     Social History   Socioeconomic History  . Marital status: Single    Spouse name: Not on file  . Number of children: Not on file  . Years of education: Not on file  . Highest education level: Not on file  Occupational History  . Not on file  Social Needs  . Financial resource strain: Not on file  . Food insecurity:    Worry: Not on file    Inability: Not on file  . Transportation needs:     Medical: Not on file    Non-medical: Not on file  Tobacco Use  . Smoking status: Never Smoker  . Smokeless tobacco: Never Used  Substance and Sexual Activity  . Alcohol use: Not Currently  . Drug use: Never  . Sexual activity: Not Currently    Birth control/protection: Surgical  Lifestyle  . Physical activity:    Days per week: Not on file    Minutes per session: Not on file  . Stress: Not on file  Relationships  . Social connections:    Talks on phone: Not on file    Gets together: Not on file    Attends religious service: Not on file    Active member of club or organization: Not on file    Attends meetings of clubs or organizations: Not on file    Relationship status: Not on file  . Intimate partner violence:    Fear of current or ex partner: Not on file    Emotionally abused: Not on file    Physically abused: Not on file    Forced sexual activity: Not on file  Other Topics Concern  .  Not on file  Social History Narrative  . Not on file   History reviewed. No pertinent family history. No current facility-administered medications on file prior to encounter.    Current Outpatient Medications on File Prior to Encounter  Medication Sig Dispense Refill  . Ibuprofen (MIDOL) 200 MG CAPS Take 2 capsules by mouth every 2 (two) hours as needed (cramping).    . Multiple Vitamins-Minerals (MULTIVITAMIN PO) Take 1 tablet by mouth daily.    Marland Kitchen acetaminophen (TYLENOL) 325 MG tablet Take 325 mg by mouth every 6 (six) hours as needed (cramping).    Marland Kitchen HYDROcodone-acetaminophen (NORCO/VICODIN) 5-325 MG per tablet Take 1 tablet by mouth every 6 (six) hours as needed for pain. (Patient not taking: Reported on 12/26/2014) 30 tablet 0  . megestrol (MEGACE) 20 MG tablet Take 2 tablets (40 mg total) by mouth 2 (two) times daily. 80 tablet 0   Allergies  Allergen Reactions  . Bactrim [Sulfamethoxazole-Trimethoprim] Hives    I have reviewed patient's Past Medical Hx, Surgical Hx, Family  Hx, Social Hx, medications and allergies.   Review of Systems  Constitutional: Positive for fatigue. Negative for chills and fever.  Gastrointestinal: Positive for abdominal pain. Negative for constipation, diarrhea, nausea and vomiting.  Genitourinary: Positive for menstrual problem, pelvic pain and vaginal bleeding. Negative for difficulty urinating, dyspareunia, dysuria, flank pain, frequency, hematuria, urgency and vaginal discharge.  Musculoskeletal: Negative for back pain.  Neurological: Positive for dizziness. Negative for syncope.  Hematological: Does not bruise/bleed easily.    OBJECTIVE Patient Vitals for the past 24 hrs:  BP Temp Temp src Pulse Resp SpO2 Height Weight  08/31/17 1810 (!) 143/68 - - 71 18 - - -  08/31/17 1719 (!) 143/68 - - 71 - - - -  08/31/17 1718 140/75 - - 73 - - - -  08/31/17 1717 (!) 143/78 - - 88 - - - -  08/31/17 1621 - - - - - - 5\' 1"  (1.549 m) 276 lb 1.3 oz (125.2 kg)  08/31/17 1614 129/74 98.3 F (36.8 C) Oral 81 18 100 % - -   Constitutional: Well-developed, well-nourished female in no acute distress.  Skin: No pallor Cardiovascular: normal rate Respiratory: normal rate and effort.  GI: Abd soft, non-tender. MS: Extremities nontender, no edema, normal ROM Neurologic: Alert and oriented x 4.  GU: Neg CVAT.  SPECULUM EXAM: NEFG, physiologic discharge, small-mod amount of dark red blood noted, cervix clean, no polyps  BIMANUAL: cervix closed; uterus ? Enlarged. Exam limited by body habitus, no adnexal tenderness or masses. No CMT.  LAB RESULTS Results for orders placed or performed during the hospital encounter of 08/31/17 (from the past 24 hour(s))  Urinalysis, Routine w reflex microscopic     Status: Abnormal   Collection Time: 08/31/17  4:27 PM  Result Value Ref Range   Color, Urine YELLOW YELLOW   APPearance HAZY (A) CLEAR   Specific Gravity, Urine 1.021 1.005 - 1.030   pH 5.0 5.0 - 8.0   Glucose, UA NEGATIVE NEGATIVE mg/dL   Hgb urine  dipstick LARGE (A) NEGATIVE   Bilirubin Urine NEGATIVE NEGATIVE   Ketones, ur NEGATIVE NEGATIVE mg/dL   Protein, ur 30 (A) NEGATIVE mg/dL   Nitrite NEGATIVE NEGATIVE   Leukocytes, UA NEGATIVE NEGATIVE   RBC / HPF >50 (H) 0 - 5 RBC/hpf   WBC, UA 21-50 0 - 5 WBC/hpf   Bacteria, UA NONE SEEN NONE SEEN   Squamous Epithelial / LPF 0-5 0 - 5  Mucus PRESENT    Sperm, UA PRESENT   Pregnancy, urine POC     Status: None   Collection Time: 08/31/17  4:31 PM  Result Value Ref Range   Preg Test, Ur NEGATIVE NEGATIVE  CBC     Status: Abnormal   Collection Time: 08/31/17  4:42 PM  Result Value Ref Range   WBC 8.3 4.0 - 10.5 K/uL   RBC 4.66 3.87 - 5.11 MIL/uL   Hemoglobin 11.4 (L) 12.0 - 15.0 g/dL   HCT 36.2 36.0 - 46.0 %   MCV 77.7 (L) 78.0 - 100.0 fL   MCH 24.5 (L) 26.0 - 34.0 pg   MCHC 31.5 30.0 - 36.0 g/dL   RDW 15.4 11.5 - 15.5 %   Platelets 353 150 - 400 K/uL  Type and screen     Status: None   Collection Time: 08/31/17  4:46 PM  Result Value Ref Range   ABO/RH(D) O NEG    Antibody Screen NEG    Sample Expiration      09/03/2017 Performed at [facility], [address], [place], [place] [address]   Wet prep, genital     Status: Abnormal   Collection Time: 08/31/17  5:04 PM  Result Value Ref Range   Yeast Wet Prep HPF POC NONE SEEN NONE SEEN   Trich, Wet Prep NONE SEEN NONE SEEN   Clue Cells Wet Prep HPF POC NONE SEEN NONE SEEN   WBC, Wet Prep HPF POC FEW (A) NONE SEEN   Sperm NONE SEEN     IMAGING No results found.  MAU COURSE Orders Placed This Encounter  Procedures  . Wet prep, genital  . Urinalysis, Routine w reflex microscopic  . CBC  . Orthostatic vital signs  . Pregnancy, urine POC  . Type and screen  . Discharge patient   Meds ordered this encounter  Medications  . ibuprofen (ADVIL,MOTRIN) 200 MG tablet    Sig: Take 3 tablets (600 mg total) by mouth every 6 (six) hours as needed for cramping.    Dispense:  30 tablet    Refill:  0    Order Specific  Question:   Supervising Provider    Answer:   Lavonia Drafts [id number]  . DISCONTD: medroxyPROGESTERone (PROVERA) 10 MG tablet    Sig: Take 2 tablets (20 mg total) by mouth as directed. 2 pills twice a day x3 days then 2 pills twice a day.    Dispense:  20 tablet    Refill:  1    Order Specific Question:   Supervising Provider    Answer:   Lavonia Drafts [id number]  . medroxyPROGESTERone (PROVERA) 10 MG tablet    Sig: Take 2 tablets (20 mg total) by mouth as directed. 2 pills twice a day x3 days then 2 pills twice a day.    Dispense:  26 tablet    Refill:  1    Order Specific Question:   Supervising Provider    Answer:   Lavonia Drafts [[id number]]    MDM - AUB, likely perimenopausal. Bleeding and VS currently stable. Will Tx w/ Provera BID x 3 days then daily for 10 days total. 1 RF. Instructed that she NEEDS EBX outpatient. States she will F/U w/ PCP and does not have Gyn. Explained that it is uncommon for PCPs to do EBX's and gave pt list of Gyns.  ASSESSMENT 1. Abnormal uterine bleeding (AUB)   2. Uterine leiomyoma, unspecified location     PLAN Discharge home in stable  condition. Bleeding precautions Follow-up Information    Primary care provider Follow up.   Why:  As needed if symptoms continue.  May need endometrial biopsy.       [facility] Follow up.   Why:  As needed in emergencies. Contact information: [address] [id number] [place] [phone]         Allergies as of 08/31/2017      Reactions   Bactrim [sulfamethoxazole-trimethoprim] Hives      Medication List    STOP taking these medications   HYDROcodone-acetaminophen 5-325 MG tablet Commonly known as:  NORCO/VICODIN   megestrol 20 MG tablet Commonly known as:  MEGACE     TAKE these medications   acetaminophen 325 MG tablet Commonly known as:  TYLENOL Take 325 mg by mouth every 6 (six) hours as  needed (cramping).   ibuprofen 200 MG tablet Commonly known as:  ADVIL,MOTRIN Take 3 tablets (600 mg total) by mouth every 6 (six) hours as needed for cramping. What changed:    how much to take  when to take this  Another medication with the same name was removed. Continue taking this medication, and follow the directions you see here.   medroxyPROGESTERone 10 MG tablet Commonly known as:  PROVERA Take 2 tablets (20 mg total) by mouth as directed. 2 pills twice a day x3 days then 2 pills twice a day.   MULTIVITAMIN PO Take 1 tablet by mouth daily.        Tamala Julian, [place], [place] 08/31/2017  6:12 PM

## 2017-08-31 NOTE — MAU Note (Signed)
Pt. States she has been bleeding for 2 weeks, but started passing larger clots more frequently today.

## 2017-09-01 LAB — GC/CHLAMYDIA PROBE AMP (~~LOC~~) NOT AT ARMC
Chlamydia: NEGATIVE
NEISSERIA GONORRHEA: NEGATIVE

## 2017-09-19 ENCOUNTER — Telehealth: Payer: Self-pay | Admitting: Obstetrics and Gynecology

## 2017-09-19 NOTE — Telephone Encounter (Signed)
Call from pharmacist, Jenny Reichmann, to clarify Rx written by Marlou Porch, CNM on 08/31/2017. V.O. Given to fill Rx for Provera 10 mg BID x 3 days then 1 po daily x 7 days for a total of 10 days.  Laury Deep, CNM  09/19/2017 3:33 PM

## 2021-02-06 DIAGNOSIS — I498 Other specified cardiac arrhythmias: Secondary | ICD-10-CM | POA: Diagnosis not present

## 2021-02-06 DIAGNOSIS — R202 Paresthesia of skin: Secondary | ICD-10-CM | POA: Diagnosis not present

## 2021-02-06 DIAGNOSIS — R5383 Other fatigue: Secondary | ICD-10-CM | POA: Diagnosis not present

## 2021-02-07 DIAGNOSIS — I498 Other specified cardiac arrhythmias: Secondary | ICD-10-CM | POA: Diagnosis not present

## 2021-06-06 DIAGNOSIS — Z124 Encounter for screening for malignant neoplasm of cervix: Secondary | ICD-10-CM | POA: Diagnosis not present

## 2021-06-06 DIAGNOSIS — Z1211 Encounter for screening for malignant neoplasm of colon: Secondary | ICD-10-CM | POA: Diagnosis not present

## 2021-06-06 DIAGNOSIS — R002 Palpitations: Secondary | ICD-10-CM | POA: Diagnosis not present

## 2021-06-06 DIAGNOSIS — Z6841 Body Mass Index (BMI) 40.0 and over, adult: Secondary | ICD-10-CM | POA: Diagnosis not present

## 2021-06-06 DIAGNOSIS — I10 Essential (primary) hypertension: Secondary | ICD-10-CM | POA: Diagnosis not present

## 2021-06-28 DIAGNOSIS — R42 Dizziness and giddiness: Secondary | ICD-10-CM | POA: Diagnosis not present

## 2021-06-28 DIAGNOSIS — I10 Essential (primary) hypertension: Secondary | ICD-10-CM | POA: Diagnosis not present

## 2021-06-28 DIAGNOSIS — R002 Palpitations: Secondary | ICD-10-CM | POA: Diagnosis not present

## 2021-07-04 DIAGNOSIS — Z01419 Encounter for gynecological examination (general) (routine) without abnormal findings: Secondary | ICD-10-CM | POA: Diagnosis not present

## 2021-07-04 DIAGNOSIS — Z1211 Encounter for screening for malignant neoplasm of colon: Secondary | ICD-10-CM | POA: Diagnosis not present

## 2021-07-04 DIAGNOSIS — N951 Menopausal and female climacteric states: Secondary | ICD-10-CM | POA: Diagnosis not present

## 2021-07-04 DIAGNOSIS — Z1231 Encounter for screening mammogram for malignant neoplasm of breast: Secondary | ICD-10-CM | POA: Diagnosis not present

## 2021-07-04 DIAGNOSIS — R69 Illness, unspecified: Secondary | ICD-10-CM | POA: Diagnosis not present

## 2021-07-17 DIAGNOSIS — R42 Dizziness and giddiness: Secondary | ICD-10-CM | POA: Diagnosis not present

## 2021-08-16 DIAGNOSIS — R002 Palpitations: Secondary | ICD-10-CM | POA: Diagnosis not present

## 2021-08-21 DIAGNOSIS — I10 Essential (primary) hypertension: Secondary | ICD-10-CM | POA: Diagnosis not present

## 2021-08-21 DIAGNOSIS — R002 Palpitations: Secondary | ICD-10-CM | POA: Diagnosis not present

## 2021-09-18 DIAGNOSIS — R69 Illness, unspecified: Secondary | ICD-10-CM | POA: Diagnosis not present

## 2021-09-18 DIAGNOSIS — H93A9 Pulsatile tinnitus, unspecified ear: Secondary | ICD-10-CM | POA: Diagnosis not present

## 2021-09-18 DIAGNOSIS — Z1330 Encounter for screening examination for mental health and behavioral disorders, unspecified: Secondary | ICD-10-CM | POA: Diagnosis not present

## 2021-09-18 DIAGNOSIS — Z780 Asymptomatic menopausal state: Secondary | ICD-10-CM | POA: Diagnosis not present

## 2021-09-18 DIAGNOSIS — G44041 Chronic paroxysmal hemicrania, intractable: Secondary | ICD-10-CM | POA: Diagnosis not present

## 2021-09-18 DIAGNOSIS — R7303 Prediabetes: Secondary | ICD-10-CM | POA: Diagnosis not present

## 2021-09-18 DIAGNOSIS — Z6841 Body Mass Index (BMI) 40.0 and over, adult: Secondary | ICD-10-CM | POA: Diagnosis not present

## 2021-09-18 DIAGNOSIS — G4733 Obstructive sleep apnea (adult) (pediatric): Secondary | ICD-10-CM | POA: Diagnosis not present

## 2021-09-18 DIAGNOSIS — Z1159 Encounter for screening for other viral diseases: Secondary | ICD-10-CM | POA: Diagnosis not present

## 2021-09-18 DIAGNOSIS — I10 Essential (primary) hypertension: Secondary | ICD-10-CM | POA: Diagnosis not present

## 2021-09-18 DIAGNOSIS — Z114 Encounter for screening for human immunodeficiency virus [HIV]: Secondary | ICD-10-CM | POA: Diagnosis not present

## 2021-09-18 DIAGNOSIS — R42 Dizziness and giddiness: Secondary | ICD-10-CM | POA: Diagnosis not present

## 2021-09-18 DIAGNOSIS — Z1331 Encounter for screening for depression: Secondary | ICD-10-CM | POA: Diagnosis not present

## 2021-10-02 DIAGNOSIS — D509 Iron deficiency anemia, unspecified: Secondary | ICD-10-CM | POA: Diagnosis not present

## 2021-10-02 DIAGNOSIS — H547 Unspecified visual loss: Secondary | ICD-10-CM | POA: Diagnosis not present

## 2021-10-21 DIAGNOSIS — G44041 Chronic paroxysmal hemicrania, intractable: Secondary | ICD-10-CM | POA: Diagnosis not present

## 2021-10-21 DIAGNOSIS — H93A9 Pulsatile tinnitus, unspecified ear: Secondary | ICD-10-CM | POA: Diagnosis not present

## 2021-10-21 DIAGNOSIS — E237 Disorder of pituitary gland, unspecified: Secondary | ICD-10-CM | POA: Diagnosis not present

## 2021-11-05 DIAGNOSIS — D500 Iron deficiency anemia secondary to blood loss (chronic): Secondary | ICD-10-CM | POA: Diagnosis not present

## 2022-01-08 DIAGNOSIS — R030 Elevated blood-pressure reading, without diagnosis of hypertension: Secondary | ICD-10-CM | POA: Diagnosis not present

## 2022-01-08 DIAGNOSIS — R200 Anesthesia of skin: Secondary | ICD-10-CM | POA: Diagnosis not present

## 2022-01-17 ENCOUNTER — Other Ambulatory Visit: Payer: Self-pay | Admitting: Gastroenterology

## 2022-01-17 DIAGNOSIS — R7303 Prediabetes: Secondary | ICD-10-CM | POA: Diagnosis not present

## 2022-01-17 DIAGNOSIS — D509 Iron deficiency anemia, unspecified: Secondary | ICD-10-CM | POA: Diagnosis not present

## 2022-01-17 DIAGNOSIS — R635 Abnormal weight gain: Secondary | ICD-10-CM | POA: Diagnosis not present

## 2022-01-17 DIAGNOSIS — E119 Type 2 diabetes mellitus without complications: Secondary | ICD-10-CM | POA: Diagnosis not present

## 2022-01-17 DIAGNOSIS — R69 Illness, unspecified: Secondary | ICD-10-CM | POA: Diagnosis not present

## 2022-01-17 DIAGNOSIS — I10 Essential (primary) hypertension: Secondary | ICD-10-CM | POA: Diagnosis not present

## 2022-01-17 DIAGNOSIS — G4730 Sleep apnea, unspecified: Secondary | ICD-10-CM | POA: Diagnosis not present

## 2022-02-06 DIAGNOSIS — I10 Essential (primary) hypertension: Secondary | ICD-10-CM | POA: Diagnosis not present

## 2022-02-06 DIAGNOSIS — E538 Deficiency of other specified B group vitamins: Secondary | ICD-10-CM | POA: Diagnosis not present

## 2022-02-06 DIAGNOSIS — D509 Iron deficiency anemia, unspecified: Secondary | ICD-10-CM | POA: Diagnosis not present

## 2022-02-06 DIAGNOSIS — R7303 Prediabetes: Secondary | ICD-10-CM | POA: Diagnosis not present

## 2022-02-06 DIAGNOSIS — Z6841 Body Mass Index (BMI) 40.0 and over, adult: Secondary | ICD-10-CM | POA: Diagnosis not present

## 2022-02-06 DIAGNOSIS — D352 Benign neoplasm of pituitary gland: Secondary | ICD-10-CM | POA: Diagnosis not present

## 2022-03-22 ENCOUNTER — Encounter (HOSPITAL_COMMUNITY): Payer: Self-pay | Admitting: Gastroenterology

## 2022-03-28 NOTE — Anesthesia Preprocedure Evaluation (Addendum)
Anesthesia Evaluation  Patient identified by MRN, date of birth, ID band Patient awake    Reviewed: Allergy & Precautions, NPO status , Patient's Chart, lab work & pertinent test results  History of Anesthesia Complications Negative for: history of anesthetic complications  Airway Mallampati: III  TM Distance: >3 FB Neck ROM: Full    Dental  (+) Partial Upper, Partial Lower, Dental Advisory Given   Pulmonary asthma    Pulmonary exam normal        Cardiovascular negative cardio ROS Normal cardiovascular exam     Neuro/Psych negative neurological ROS     GI/Hepatic negative GI ROS, Neg liver ROS,,,  Endo/Other    Morbid obesity  Renal/GU negative Renal ROS     Musculoskeletal negative musculoskeletal ROS (+)    Abdominal   Peds  Hematology  (+) Blood dyscrasia, anemia   Anesthesia Other Findings   Reproductive/Obstetrics                              Anesthesia Physical Anesthesia Plan  ASA: 3  Anesthesia Plan: MAC   Post-op Pain Management: Minimal or no pain anticipated   Induction:   PONV Risk Score and Plan: 2 and Ondansetron and Propofol infusion  Airway Management Planned: Nasal CPAP  Additional Equipment:   Intra-op Plan:   Post-operative Plan:   Informed Consent: I have reviewed the patients History and Physical, chart, labs and discussed the procedure including the risks, benefits and alternatives for the proposed anesthesia with the patient or authorized representative who has indicated his/her understanding and acceptance.     Dental advisory given  Plan Discussed with: Anesthesiologist and CRNA  Anesthesia Plan Comments:         Anesthesia Quick Evaluation

## 2022-03-29 ENCOUNTER — Encounter (HOSPITAL_COMMUNITY)
Admission: RE | Disposition: A | Discharge status: Home / Self Care | Payer: Self-pay | Source: Ambulatory Visit | Attending: Gastroenterology

## 2022-03-29 ENCOUNTER — Other Ambulatory Visit: Payer: Self-pay

## 2022-03-29 ENCOUNTER — Encounter (HOSPITAL_COMMUNITY): Payer: Self-pay | Admitting: Gastroenterology

## 2022-03-29 ENCOUNTER — Ambulatory Visit (HOSPITAL_COMMUNITY): Payer: Managed Care, Other (non HMO) | Admitting: Anesthesiology

## 2022-03-29 ENCOUNTER — Ambulatory Visit (HOSPITAL_COMMUNITY)
Admission: RE | Admit: 2022-03-29 | Discharge: 2022-03-29 | Disposition: A | Discharge status: Home / Self Care | Payer: Managed Care, Other (non HMO) | Source: Ambulatory Visit | Attending: Gastroenterology | Admitting: Gastroenterology

## 2022-03-29 ENCOUNTER — Ambulatory Visit (HOSPITAL_BASED_OUTPATIENT_CLINIC_OR_DEPARTMENT_OTHER): Payer: Managed Care, Other (non HMO) | Admitting: Anesthesiology

## 2022-03-29 DIAGNOSIS — J45909 Unspecified asthma, uncomplicated: Secondary | ICD-10-CM

## 2022-03-29 DIAGNOSIS — Z1211 Encounter for screening for malignant neoplasm of colon: Secondary | ICD-10-CM | POA: Diagnosis not present

## 2022-03-29 DIAGNOSIS — D649 Anemia, unspecified: Secondary | ICD-10-CM | POA: Diagnosis not present

## 2022-03-29 DIAGNOSIS — K5730 Diverticulosis of large intestine without perforation or abscess without bleeding: Secondary | ICD-10-CM | POA: Diagnosis not present

## 2022-03-29 DIAGNOSIS — Z6841 Body Mass Index (BMI) 40.0 and over, adult: Secondary | ICD-10-CM

## 2022-03-29 HISTORY — PX: COLONOSCOPY WITH PROPOFOL: SHX5780

## 2022-03-29 SURGERY — COLONOSCOPY WITH PROPOFOL
Anesthesia: Monitor Anesthesia Care

## 2022-03-29 MED ORDER — LIDOCAINE 2% (20 MG/ML) 5 ML SYRINGE
INTRAMUSCULAR | Status: DC | PRN
  Administered 2022-03-29: 100 mg via INTRAVENOUS

## 2022-03-29 MED ORDER — PHENYLEPHRINE HCL (PRESSORS) 10 MG/ML IV SOLN
INTRAVENOUS | Status: AC
  Filled 2022-03-29: qty 1

## 2022-03-29 MED ORDER — LACTATED RINGERS IV SOLN
INTRAVENOUS | Status: AC | PRN
  Administered 2022-03-29: 1000 mL via INTRAVENOUS

## 2022-03-29 MED ORDER — PROPOFOL 10 MG/ML IV BOLUS
INTRAVENOUS | Status: DC | PRN
  Administered 2022-03-29: 30 mg via INTRAVENOUS

## 2022-03-29 MED ORDER — PROPOFOL 500 MG/50ML IV EMUL
INTRAVENOUS | Status: DC | PRN
  Administered 2022-03-29: 125 ug/kg/min via INTRAVENOUS

## 2022-03-29 MED ORDER — PROPOFOL 500 MG/50ML IV EMUL
INTRAVENOUS | Status: AC
  Filled 2022-03-29: qty 50

## 2022-03-29 MED ORDER — PROPOFOL 1000 MG/100ML IV EMUL
INTRAVENOUS | Status: AC
  Filled 2022-03-29: qty 100

## 2022-03-29 MED ORDER — SODIUM CHLORIDE 0.9 % IV SOLN: INTRAVENOUS | Status: DC

## 2022-03-29 SURGICAL SUPPLY — 22 items

## 2022-03-29 NOTE — Anesthesia Procedure Notes (Signed)
Procedure Name: MAC Date/Time: 03/29/2022 7:40 AM  Performed by: Jenne Campus, CRNAPre-anesthesia Checklist: Patient identified, Emergency Drugs available, Suction available and Patient being monitored Oxygen Delivery Method: Nasal cannula

## 2022-03-29 NOTE — H&P (Signed)
Haley Weber HPI: This 57 year old black female presents to the office for colorectal cancer screening. She has 2-3 BM's per day. She had bright red blood on the toilet tissue last week. She started to have episodic LLQ pain and nausea which started a month ago. She has irregular menstrual cycles; her last one was in May, 2023; the cycles can be heavy at times. There are no exacerbating or alleviating factors with regards to the left lower quadrant pain. She has a good appetite and her weight has been stable. She denies having any complaints of vomiting, acid reflux, dysphagia or odynophagia. She denies having a family history of colon cancer, celiac sprue or IBD.   Past Medical History:  Diagnosis Date   Anemia    Anxiety    no meds   Asthma    rarely used inhaler   History of blood transfusion 02/25/12   At [facility], 2 units transfused   SVD (spontaneous vaginal delivery)    x 2    Past Surgical History:  Procedure Laterality Date   DILITATION & CURRETTAGE/HYSTROSCOPY WITH VERSAPOINT RESECTION N/A 06/30/2012   Procedure: DILATATION & CURETTAGE/HYSTEROSCOPY WITH thermachoice;  Surgeon: Eldred Manges, MD;  Location: [facility];  Service: Gynecology;  Laterality: N/A;   left acl repair     TUBAL LIGATION      History reviewed. No pertinent family history.  Social History:  reports that she has never smoked. She has never used smokeless tobacco. She reports that she does not currently use alcohol. She reports that she does not use drugs.  Allergies:  Allergies  Allergen Reactions   Bactrim [Sulfamethoxazole-Trimethoprim] Hives   Iodinated Contrast Media Nausea And Vomiting    Medications: Scheduled: Continuous:  sodium chloride     lactated ringers 1,000 mL (03/29/22 0654)    No results found for this or any previous visit (from the past 24 hour(s)).   No results found.  ROS:  As stated above in the HPI otherwise negative.  Blood pressure (!) 173/77, pulse 80,  temperature 97.6 F (36.4 C), temperature source Tympanic, resp. rate 18, height '5\' 1"'$  (1.549 m), weight 127 kg, SpO2 100 %.    PE: Gen: NAD, Alert and Oriented HEENT:  [place]/AT, EOMI Neck: Supple, no LAD Lungs: CTA Bilaterally CV: RRR without M/G/R ABD: Soft, NTND, +BS Ext: No C/C/E  Assessment/Plan: 1) Screening colonoscopy.  Haley Weber D 03/29/2022, 7:23 AM

## 2022-03-29 NOTE — Transfer of Care (Signed)
Immediate Anesthesia Transfer of Care Note  Patient: Haley Weber  Procedure(s) Performed: COLONOSCOPY WITH PROPOFOL MAC Patient Location: Endoscopy Unit  Anesthesia Type:MAC  Level of Consciousness: awake, oriented, and patient cooperative  Airway & Oxygen Therapy: Patient Spontanous Breathing and Patient connected to nasal cannula oxygen  Post-op Assessment: Report given to RN and Post -op Vital signs reviewed and stable  Post vital signs: Reviewed  Last Vitals:  Vitals Value Taken Time  BP 131/82 03/29/22 0820  Temp 36.4 C 03/29/22 0815  Pulse 72 03/29/22 0823  Resp 13 03/29/22 0823  SpO2 100 % 03/29/22 0823  Vitals shown include unvalidated device data.  Last Pain:  Vitals:   03/29/22 0820  TempSrc:   PainSc: 0-No pain         Complications: No notable events documented.

## 2022-03-29 NOTE — Anesthesia Postprocedure Evaluation (Signed)
Anesthesia Post Note  Patient: Haley Weber  Procedure(s) Performed: COLONOSCOPY WITH PROPOFOL     Patient location during evaluation: PACU Anesthesia Type: MAC Level of consciousness: awake and alert Pain management: pain level controlled Vital Signs Assessment: post-procedure vital signs reviewed and stable Respiratory status: spontaneous breathing and respiratory function stable Cardiovascular status: stable Postop Assessment: no apparent nausea or vomiting Anesthetic complications: no   No notable events documented.  Last Vitals:  Vitals:   03/29/22 0825 03/29/22 0830  BP: 136/82 (!) 140/77  Pulse: 74 71  Resp: 18 18  Temp:    SpO2: 100%     Last Pain:  Vitals:   03/29/22 0830  TempSrc:   PainSc: 0-No pain                 Warnie Belair DANIEL

## 2022-03-29 NOTE — Op Note (Signed)
[facility] Patient Name: Haley Weber Procedure Date: 03/29/2022 MRN: [id number] Attending MD: Carol Ada , MD, [id number] Date of Birth: [birth year] CSN: [id number] Age: 57 Admit Type: Outpatient Procedure:                Colonoscopy Indications:              Screening for colorectal malignant neoplasm Providers:                Carol Ada, MD, Jamison Neighbor RN, RN, Brien Mates, Technician Referring MD:              Medicines:                Propofol per Anesthesia Complications:            No immediate complications. Estimated Blood Loss:     Estimated blood loss: none. Procedure:                Pre-Anesthesia Assessment:                           - Prior to the procedure, a History and Physical                            was performed, and patient medications and                            allergies were reviewed. The patient's tolerance of                            previous anesthesia was also reviewed. The risks                            and benefits of the procedure and the sedation                            options and risks were discussed with the patient.                            All questions were answered, and informed consent                            was obtained. Prior Anticoagulants: The patient has                            taken no anticoagulant or antiplatelet agents. ASA                            Grade Assessment: III - A patient with severe                            systemic disease. After reviewing the risks and  benefits, the patient was deemed in satisfactory                            condition to undergo the procedure.                           - Sedation was administered by an anesthesia                            professional. Deep sedation was attained.                           After obtaining informed consent, the colonoscope                            was passed under  direct vision. Throughout the                            procedure, the patient's blood pressure, pulse, and                            oxygen saturations were monitored continuously. The                            [id number] Olympus colonoscope was                            introduced through the anus and advanced to the the                            cecum, identified by appendiceal orifice and                            ileocecal valve. The colonoscopy was performed                            without difficulty. The patient tolerated the                            procedure well. The quality of the bowel                            preparation was evaluated using the BBPS [facility]                            Bowel Preparation Scale) with scores of: Right                            Colon = 3 (entire mucosa seen well with no residual                            staining, small fragments of stool or opaque  liquid), Transverse Colon = 3 (entire mucosa seen                            well with no residual staining, small fragments of                            stool or opaque liquid) and Left Colon = 3 (entire                            mucosa seen well with no residual staining, small                            fragments of stool or opaque liquid). The total                            BBPS score equals 9. The quality of the bowel                            preparation was good. The ileocecal valve,                            appendiceal orifice, and rectum were photographed. Scope In: 7:44:44 AM Scope Out: 7:58:00 AM Scope Withdrawal Time: 0 hours 9 minutes 54 seconds  Total Procedure Duration: 0 hours 13 minutes 16 seconds  Findings:      Multiple large-mouthed and small-mouthed diverticula were found in the       sigmoid colon. Impression:               - Diverticulosis in the sigmoid colon.                           - No specimens collected. Moderate  Sedation:      Not Applicable - Patient had care per Anesthesia. Recommendation:           - Patient has a contact number available for                            emergencies. The signs and symptoms of potential                            delayed complications were discussed with the                            patient. Return to normal activities tomorrow.                            Written discharge instructions were provided to the                            patient.                           - Resume previous diet.                           -  Continue present medications.                           - Repeat colonoscopy in 10 years for screening                            purposes. Procedure Code(s):        --- Professional ---                           [phone], Colonoscopy, flexible; diagnostic, including                            collection of specimen(s) by brushing or washing,                            when performed (separate procedure) Diagnosis Code(s):        --- Professional ---                           Z12.11, Encounter for screening for malignant                            neoplasm of colon                           K57.30, Diverticulosis of large intestine without                            perforation or abscess without bleeding CPT copyright 2022 American Medical Association. All rights reserved. The codes documented in this report are preliminary and upon coder review may  be revised to meet current compliance requirements. Carol Ada, MD Carol Ada, MD 03/29/2022 8:12:23 AM This report has been signed electronically. Number of Addenda: 0

## 2022-04-01 ENCOUNTER — Encounter (HOSPITAL_COMMUNITY): Payer: Self-pay | Admitting: Gastroenterology

## 2022-04-11 ENCOUNTER — Emergency Department (HOSPITAL_COMMUNITY): Payer: Managed Care, Other (non HMO)

## 2022-04-11 ENCOUNTER — Other Ambulatory Visit: Payer: Self-pay

## 2022-04-11 ENCOUNTER — Encounter (HOSPITAL_COMMUNITY): Payer: Self-pay

## 2022-04-11 ENCOUNTER — Encounter (HOSPITAL_COMMUNITY): Payer: Self-pay | Admitting: *Deleted

## 2022-04-11 ENCOUNTER — Emergency Department (HOSPITAL_COMMUNITY)
Admission: EM | Admit: 2022-04-11 | Discharge: 2022-04-12 | Disposition: A | Discharge status: Home / Self Care | Payer: Managed Care, Other (non HMO) | Attending: Emergency Medicine | Admitting: Emergency Medicine

## 2022-04-11 ENCOUNTER — Ambulatory Visit (HOSPITAL_COMMUNITY)
Admission: EM | Admit: 2022-04-11 | Discharge: 2022-04-11 | Disposition: A | Discharge status: Other Acute Inpatient Hospital | Payer: Managed Care, Other (non HMO) | Attending: Family Medicine | Admitting: Family Medicine

## 2022-04-11 DIAGNOSIS — J45909 Unspecified asthma, uncomplicated: Secondary | ICD-10-CM | POA: Diagnosis not present

## 2022-04-11 DIAGNOSIS — I10 Essential (primary) hypertension: Secondary | ICD-10-CM | POA: Diagnosis not present

## 2022-04-11 DIAGNOSIS — R6884 Jaw pain: Secondary | ICD-10-CM

## 2022-04-11 DIAGNOSIS — R0789 Other chest pain: Secondary | ICD-10-CM | POA: Diagnosis not present

## 2022-04-11 DIAGNOSIS — R079 Chest pain, unspecified: Secondary | ICD-10-CM | POA: Diagnosis not present

## 2022-04-11 DIAGNOSIS — Z79899 Other long term (current) drug therapy: Secondary | ICD-10-CM | POA: Diagnosis not present

## 2022-04-11 LAB — BASIC METABOLIC PANEL
Anion gap: 10 (ref 5–15)
BUN: 10 mg/dL (ref 6–20)
CO2: 23 mmol/L (ref 22–32)
Calcium: 9.1 mg/dL (ref 8.9–10.3)
Chloride: 104 mmol/L (ref 98–111)
Creatinine, Ser: 0.89 mg/dL (ref 0.44–1.00)
GFR, Estimated: >60 mL/min (ref >60)
Glucose, Bld: 91 mg/dL (ref 70–99)
Potassium: 4.1 mmol/L (ref 3.5–5.1)
Sodium: 137 mmol/L (ref 135–145)

## 2022-04-11 LAB — CBC
HCT: 42.9 % (ref 36.0–46.0)
Hemoglobin: 12.7 g/dL (ref 12.0–15.0)
MCH: 23.6 pg — ABNORMAL LOW (ref 26.0–34.0)
MCHC: 29.6 g/dL — ABNORMAL LOW (ref 30.0–36.0)
MCV: 79.9 fL — ABNORMAL LOW (ref 80.0–100.0)
Platelets: 359 10*3/uL (ref 150–400)
RBC: 5.37 MIL/uL — ABNORMAL HIGH (ref 3.87–5.11)
RDW: 17 % — ABNORMAL HIGH (ref 11.5–15.5)
WBC: 9.3 10*3/uL (ref 4.0–10.5)
nRBC: 0 % (ref 0.0–0.2)

## 2022-04-11 LAB — TROPONIN I (HIGH SENSITIVITY): Troponin I (High Sensitivity): 4 ng/L (ref <18)

## 2022-04-11 NOTE — ED Triage Notes (Signed)
Pt arrived to ED from UC. Pt states that starting around 4pm she was having jaw, back and left sided chest pain. Pt states she was feeling very hot and sweating.  On arrival to ED she is now only having left chest and back discomfort. 1/10  Denies feeling lightheaded or dizzy and denies nausea   Pt states recent colonoscopy

## 2022-04-11 NOTE — ED Provider Triage Note (Signed)
Emergency Medicine Provider Triage Evaluation Note  DIANELYS GENGLER , a 57 y.o. female  was evaluated in triage.  Patient complains of pain to the right jaw and some anterior left chest wall pain.  Started today.  She said that she felt clammy but denied any diaphoresis.  Also says that her back felt "cold."  Says that she feels much better now but urgent care center here.  Review of Systems  Positive:  Negative: Chest pain, palpitations, shortness of breath  Physical Exam  BP (!) 135/93   Pulse 92   Temp 98.6 F (37 C)   Resp 16   Ht '5\' 1"'$  (1.549 m)   Wt 127 kg   SpO2 100%   BMI 52.90 kg/m  Gen:   Awake, no distress   Resp:  Normal effort  MSK:   Moves extremities without difficulty  Other:  Reproducible anterior left chest wall discomfort, RRR  Medical Decision Making  Medically screening exam initiated at 9:01 PM.  Appropriate orders placed.  VENICE EOFF was informed that the remainder of the evaluation will be completed by another provider, this initial triage assessment does not replace that evaluation, and the importance of remaining in the ED until their evaluation is complete.     Rhae Hammock, PA-C 04/11/22 2102

## 2022-04-11 NOTE — ED Triage Notes (Signed)
PT reports LT CP, RT jaw pain and upper back pain

## 2022-04-12 LAB — TROPONIN I (HIGH SENSITIVITY): Troponin I (High Sensitivity): 4 ng/L (ref <18)

## 2022-04-12 NOTE — ED Provider Notes (Signed)
[facility] Provider Note  CSN: [id number] Arrival date & time: 04/11/22 2039  Chief Complaint(s) Chest Pain  HPI Haley Weber is a 57 y.o. female with a past medical history listed below including hypertension who presents to the emergency department with brief episode of left-sided chest pain described as aching sensation that lasted few minutes and resolved on its own.  Patient was driving at that time.  There was no pain radiating though several minutes later she felt pain in her right jaw as well as her back.  Patient reported feeling hot and sweaty at that time.  This occurred around 4 PM.  No associated nausea, vomiting, shortness of breath.  Patient reported soreness after the chest pain that has subsequently resolved.  Patient has been up and about since and has not had any exertional chest pain or shortness of breath.   Patient denies any recent fevers or infections.  No cough or congestion.  HPI  Past Medical History Past Medical History:  Diagnosis Date   Anemia    Anxiety    no meds   Asthma    rarely used inhaler   History of blood transfusion 02/25/12   At [facility], 2 units transfused   SVD (spontaneous vaginal delivery)    x 2   Patient Active Problem List   Diagnosis Date Noted   KNEE PAIN, LEFT 05/01/2009   B12 DEFICIENCY 04/03/2009   OTHER IMPACTION OF INTESTINE 03/28/2009   MELENA, HX OF 03/28/2009   VITAMIN D DEFICIENCY 02/09/2009   ABSCESS, TOOTH 02/09/2009   FIBROIDS, UTERUS 01/16/2009   ANEMIA-IRON DEFICIENCY 01/09/2009   ASTHMA 01/09/2009   MENORRHAGIA 01/09/2009   PALPITATIONS, RECURRENT 01/09/2009   ABDOMINAL BLOATING 01/09/2009   Home Medication(s) Prior to Admission medications   Medication Sig Start Date End Date Taking? Authorizing Provider  acetaminophen (TYLENOL) 325 MG tablet Take 325 mg by mouth every 6 (six) hours as needed (cramping).    [provider]  ibuprofen  (ADVIL,MOTRIN) 200 MG tablet Take 3 tablets (600 mg total) by mouth every 6 (six) hours as needed for cramping. 08/31/17   Tamala Julian, [place], CNM  losartan (COZAAR) 25 MG tablet Take 25 mg by mouth daily.    [provider]  medroxyPROGESTERone (PROVERA) 10 MG tablet Take 2 tablets (20 mg total) by mouth as directed. 2 pills twice a day x3 days then 2 pills twice a day. 08/31/17   Tamala Julian, [place], CNM  Multiple Vitamins-Minerals (MULTIVITAMIN PO) Take 1 tablet by mouth daily.    [provider]                                                                                                                                    Allergies Bactrim [sulfamethoxazole-trimethoprim] and Iodinated contrast media  Review of Systems Review of Systems As noted in HPI  Physical Exam Vital Signs  I have  reviewed the triage vital signs BP (!) 155/86   Pulse 72   Temp 98.7 F (37.1 C) (Oral)   Resp 20   Ht 5\' 1"  (1.549 m)   Wt 127 kg   SpO2 99%   BMI 52.90 kg/m   Physical Exam Vitals reviewed.  Constitutional:      General: She is not in acute distress.    Appearance: She is well-developed. She is not diaphoretic.  HENT:     Head: Normocephalic and atraumatic.     Nose: Nose normal.  Eyes:     General: No scleral icterus.       Right eye: No discharge.        Left eye: No discharge.     Conjunctiva/sclera: Conjunctivae normal.     Pupils: Pupils are equal, round, and reactive to light.  Cardiovascular:     Rate and Rhythm: Normal rate and regular rhythm.     Heart sounds: No murmur heard.    No friction rub. No gallop.  Pulmonary:     Effort: Pulmonary effort is normal. No respiratory distress.     Breath sounds: Normal breath sounds. No stridor. No rales.  Abdominal:     General: There is no distension.     Palpations: Abdomen is soft.     Tenderness: There is no abdominal tenderness.  Musculoskeletal:        General: No tenderness.     Cervical back: Normal range of  motion and neck supple.  Skin:    General: Skin is warm and dry.     Findings: No erythema or rash.  Neurological:     Mental Status: She is alert and oriented to person, place, and time.     ED Results and Treatments Labs (all labs ordered are listed, but only abnormal results are displayed) Labs Reviewed  CBC - Abnormal; Notable for the following components:      Result Value   RBC 5.37 (*)    MCV 79.9 (*)    MCH 23.6 (*)    MCHC 29.6 (*)    RDW 17.0 (*)    All other components within normal limits  BASIC METABOLIC PANEL  TROPONIN I (HIGH SENSITIVITY)  TROPONIN I (HIGH SENSITIVITY)                                                                                                                         EKG   Radiology DG Chest 2 View  Result Date: 04/11/2022 CLINICAL DATA:  Shortness of breath. EXAM: CHEST - 2 VIEW COMPARISON:  06/29/2008. FINDINGS: The heart size and mediastinal contours are within normal limits. Both lungs are clear. Mild degenerative changes are present in the thoracic spine. No acute osseous abnormality. IMPRESSION: No active cardiopulmonary disease. Electronically Signed   By: Brett Fairy M.D.   On: 04/11/2022 21:43    Medications Ordered in ED Medications - No data to display  Procedures Procedures  (including critical care time)  Medical Decision Making / ED Course  Click here for ABCD2, HEART and other calculators  Medical Decision Making Amount and/or Complexity of Data Reviewed Labs: ordered. Decision-making details documented in ED Course. Radiology: ordered and independent interpretation performed. Decision-making details documented in ED Course. ECG/medicine tests: ordered and independent interpretation performed. Decision-making details documented in ED Course.    Atypical chest pain. Differential includes  but not limited to ACS, PE, dissection, pneumothorax, pneumonia, MSK, GI related process  EKG without acute ischemic changes or evidence of pericarditis. Heart score less than 3 Initial troponin negative Delta troponin negative.  Presentation not classic for aortic dissection or esophageal perforation. Low suspicion for pulmonary embolism. Chest x-ray without evidence of pneumonia, pneumothorax, pulmonary edema or pleural effusions  CBC without leukocytosis or anemia Metabolic panel without significant electrolyte derangements or renal sufficiency    She remains chest pain free.   Final Clinical Impression(s) / ED Diagnoses Final diagnoses:  Atypical chest pain   The patient appears reasonably screened and/or stabilized for discharge and I doubt any other medical condition or other [facility] requiring further screening, evaluation, or treatment in the ED at this time. I have discussed the findings, Dx and Tx plan with the patient/family who expressed understanding and agree(s) with the plan. Discharge instructions discussed at length. The patient/family was given strict return precautions who verbalized understanding of the instructions. No further questions at time of discharge.  Disposition: Discharge  Condition: Good  ED Discharge Orders     None         Follow Up: Primary care provider  Call  to schedule an appointment for close follow up  [place] [phone] Call  to schedule an appointment for close follow up            This chart was dictated using voice recognition software.  Despite best efforts to proofread,  errors can occur which can change the documentation meaning.    Fatima Blank, MD 04/12/22 [phone]

## 2022-04-12 NOTE — ED Notes (Signed)
Called to lab regarding the 2nd troponin that was collected at 2306 by [facility], however lab advised they received the wrong tube, they received a dark green top and not the correct specimen (light green) for the troponin. Pt will need recollect.

## 2022-04-16 NOTE — ED Provider Notes (Signed)
[place]   [id number] 04/11/22 Arrival Time: 1942  ASSESSMENT & PLAN:  1. Chest pain, unspecified type   2. Pain in lower jaw    Cannot r/o cardiac etiology/ACS. Discussed. To ED via POV for further evaluation. Stable upon discharge.  ECG: Performed today and interpreted by me: normal EKG, normal sinus rhythm. No STEMI.  Reviewed expectations re: course of current medical issues. Questions answered. Outlined signs and symptoms indicating need for more acute intervention. Patient verbalized understanding. After Visit Summary given.   SUBJECTIVE:  History from: patient. KAROL SEGAWA is a 57 y.o. female who presents with complaint of mid to L-sided CP radiating to R jaw; first felt a couple of hours ago; "got sweaty too". Questions mild associated SOB. Lasted several minutes while driving. Currently "feeling it a little"; 1/10. Without associated n/v. Normal ambulation. No recent illnesses. No h/o similar.  Denies: fatigue, irregular heart beat, lower extremity edema, near-syncope, orthopnea, palpitations, paroxysmal nocturnal dyspnea, and syncope. Denies recreational drug use.  Social History   Tobacco Use  Smoking Status Never  Smokeless Tobacco Never   Social History   Substance and Sexual Activity  Alcohol Use Not Currently   OBJECTIVE:  Vitals:   04/11/22 2007  BP: 131/76  Pulse: 80  Resp: 20  Temp: 98.3 F (36.8 C)  SpO2: 98%    General appearance: alert, oriented, no acute distress Eyes: PERRLA; EOMI; conjunctivae normal HENT: normocephalic; atraumatic Neck: supple with FROM Lungs: without labored respirations; speaks full sentences without difficulty; CTAB Heart: regular rate and rhythm Chest Wall: without tenderness to palpation Abdomen: soft, non-tender; no guarding or rebound tenderness Extremities: without edema; without calf swelling or tenderness; symmetrical without gross deformities Skin: warm and dry; without rash or  lesions Neuro: normal gait Psychological: alert and cooperative; normal mood and affect  Allergies  Allergen Reactions   Bactrim [Sulfamethoxazole-Trimethoprim] Hives   Iodinated Contrast Media Nausea And Vomiting    Past Medical History:  Diagnosis Date   Anemia    Anxiety    no meds   Asthma    rarely used inhaler   History of blood transfusion 02/25/12   At [facility], 2 units transfused   SVD (spontaneous vaginal delivery)    x 2   Social History   Socioeconomic History   Marital status: Single    Spouse name: Not on file   Number of children: Not on file   Years of education: Not on file   Highest education level: Not on file  Occupational History   Not on file  Tobacco Use   Smoking status: Never   Smokeless tobacco: Never  Substance and Sexual Activity   Alcohol use: Not Currently   Drug use: Never   Sexual activity: Not Currently    Birth control/protection: Surgical  Other Topics Concern   Not on file  Social History Narrative   Not on file   Social Determinants of Health   Financial Resource Strain: Not on file  Food Insecurity: Not on file  Transportation Needs: Not on file  Physical Activity: Not on file  Stress: Not on file  Social Connections: Not on file  Intimate Partner Violence: Not on file   History reviewed. No pertinent family history. Past Surgical History:  Procedure Laterality Date   COLONOSCOPY WITH PROPOFOL N/A 03/29/2022   Procedure: COLONOSCOPY WITH PROPOFOL;  Surgeon: Carol Ada, MD;  Location: [facility];  Service: Gastroenterology;  Laterality: N/A;   DILITATION & CURRETTAGE/HYSTROSCOPY WITH VERSAPOINT RESECTION  N/A 06/30/2012   Procedure: DILATATION & CURETTAGE/HYSTEROSCOPY WITH thermachoice;  Surgeon: Eldred Manges, MD;  Location: [place];  Service: Gynecology;  Laterality: N/A;   left acl repair     TUBAL LIGATION        Vanessa Kick, MD 04/16/22 1315

## 2022-05-27 ENCOUNTER — Ambulatory Visit (HOSPITAL_BASED_OUTPATIENT_CLINIC_OR_DEPARTMENT_OTHER): Payer: Managed Care, Other (non HMO) | Admitting: Cardiovascular Disease

## 2022-05-27 ENCOUNTER — Encounter (HOSPITAL_BASED_OUTPATIENT_CLINIC_OR_DEPARTMENT_OTHER): Payer: Self-pay | Admitting: Cardiovascular Disease

## 2022-05-27 VITALS — BP 126/78 | HR 77 | Ht 61.0 in | Wt 289.1 lb

## 2022-05-27 DIAGNOSIS — I10 Essential (primary) hypertension: Secondary | ICD-10-CM | POA: Diagnosis not present

## 2022-05-27 DIAGNOSIS — R079 Chest pain, unspecified: Secondary | ICD-10-CM

## 2022-05-27 DIAGNOSIS — G4733 Obstructive sleep apnea (adult) (pediatric): Secondary | ICD-10-CM

## 2022-05-27 HISTORY — DX: Obstructive sleep apnea (adult) (pediatric): G47.33

## 2022-05-27 HISTORY — DX: Chest pain, unspecified: R07.9

## 2022-05-27 HISTORY — DX: Essential (primary) hypertension: I10

## 2022-05-27 HISTORY — DX: Morbid (severe) obesity due to excess calories: E66.01

## 2022-05-27 NOTE — Assessment & Plan Note (Signed)
Keep working on diet and exercise.  Continue Ozempic.

## 2022-05-27 NOTE — Assessment & Plan Note (Signed)
Blood pressure is well-controlled on losartan/HCTZ.

## 2022-05-27 NOTE — Assessment & Plan Note (Signed)
Haley Weber has episodes of chest pain that are non-exertional.  She does exercise regularly and has no exertional symptoms.  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  I suspect this is more related to musculoskeletal strain, though she has no chest wall tenderness on exam today.  It could also be radicular pain.  I do not think any ischemia evaluation is indicated at this time.  We will get a coronary calcium score to better understand her overall risk and lipid goals.

## 2022-05-27 NOTE — Assessment & Plan Note (Signed)
Continue CPAP therapy 

## 2022-05-27 NOTE — Progress Notes (Signed)
Cardiology Office Note:    Date:  05/27/2022   ID:  DEJA PISARSKI, DOB [birth year], MRN [id number]  PCP:  Judie Bonus, MD   [facility] Providers Cardiologist:  None     Referring MD: No ref. provider found   CC: chest pain   History of Present Illness:    Haley Weber is a 57 y.o. female with a hx of hypertension, anemia, asthma, and anxiety, here for the evaluation of chest pain. On 04/11/2022 she presented to urgent care with complaints of mid to left-sided chest pain radiating to her right jaw, lasting several minutes while driving. EKG was NSR without STEMI. She was recommended to go to the ER, which she did. EKG in the ER was without acute ischemic changes or evidence of pericarditis. Initial and delta troponins were negative. Chest x-ray was unremarkable. She was discharged in good condition.  Today, she reports having chest pain intermittently for 2 years. On the day she went to the ED, she had felt a little twinge associated with numbness in her tongue. At the time she was nearly done with work for the day. 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 When she starts walking her chest pain will usually resolve, although she also complains of frequent muscle cramps. Her chest pain was not reproducible on exam today. If she walks a lot she notices LE swelling if she doesn't use support hose. Sometimes she has sharp pains in her frontal head associated with cluster headaches. Since December she has been on a CPAP. Her sleep quality has improved but her breathing seems to be worse, which she attributes to excess moisture from her CPAP. In the past month she occasionally wakes up and takes a "double breath." She demonstrates this as 2 very quick breaths in rapid succession. This has occurred whether or not she uses the CPAP. For exercise she swims, walks, and gardens. She denies any anginal symptoms. Of note, she states that a lot of her symptoms and issues as above have an  onset that correlates with or is subsequent to starting menopause. For a few days she took magnesium and felt a little better. She denies any palpitations, lightheadedness, syncope, orthopnea, or PND.   Past Medical History:  Diagnosis Date   Anemia    Anxiety    no meds   Asthma    rarely used inhaler   Chest pain of uncertain etiology 05/27/2022   Essential hypertension 05/27/2022   History of blood transfusion 02/25/2012   At [facility], 2 units transfused   Morbid obesity (HCC) 05/27/2022   OSA on CPAP 05/27/2022   SVD (spontaneous vaginal delivery)    x 2    Past Surgical History:  Procedure Laterality Date   COLONOSCOPY WITH PROPOFOL N/A 03/29/2022   Procedure: COLONOSCOPY WITH PROPOFOL;  Surgeon: Jeani Hawking, MD;  Location: [facility];  Service: Gastroenterology;  Laterality: N/A;   DILITATION & CURRETTAGE/HYSTROSCOPY WITH VERSAPOINT RESECTION N/A 06/30/2012   Procedure: DILATATION & CURETTAGE/HYSTEROSCOPY WITH thermachoice;  Surgeon: [name], MD;  Location: [facility];  Service: Gynecology;  Laterality: N/A;   left acl repair     TUBAL LIGATION      Current Medications: Current Meds  Medication Sig   acetaminophen (TYLENOL) 325 MG tablet Take 325 mg by mouth every 6 (six) hours as needed (cramping).   ibuprofen (ADVIL,MOTRIN) 200 MG tablet Take 3 tablets (600 mg total) by mouth every 6 (six) hours as needed for cramping.  losartan-hydrochlorothiazide (HYZAAR) 50-12.5 MG tablet Take 1 tablet by mouth daily.   Multiple Vitamins-Minerals (MULTIVITAMIN PO) Take 1 tablet by mouth daily.   OZEMPIC, 0.25 OR 0.5 MG/DOSE, 2 MG/3ML SOPN Inject 0.25 mg into the skin once a week.     Allergies:   Bactrim [sulfamethoxazole-trimethoprim] and Iodinated contrast media   Social History   Socioeconomic History   Marital status: Single    Spouse name: Not on file   Number of children: Not on file   Years of education: Not on file   Highest education level: Not on  file  Occupational History   Not on file  Tobacco Use   Smoking status: Never   Smokeless tobacco: Never  Substance and Sexual Activity   Alcohol use: Not Currently   Drug use: Never   Sexual activity: Not Currently    Birth control/protection: Surgical  Other Topics Concern   Not on file  Social History Narrative   Not on file   Social Determinants of Health   Financial Resource Strain: Not on file  Food Insecurity: No Food Insecurity (05/27/2022)   Hunger Vital Sign    Worried About Running Out of Food in the Last Year: Never true    Ran Out of Food in the Last Year: Never true  Transportation Needs: No Transportation Needs (05/27/2022)   PRAPARE - Administrator, Civil Service (Medical): No    Lack of Transportation (Non-Medical): No  Physical Activity: Insufficiently Active (05/27/2022)   Exercise Vital Sign    Days of Exercise per Week: 2 days    Minutes of Exercise per Session: 30 min  Stress: Not on file  Social Connections: Not on file     Family History: The patient's family history includes Diabetes in her father; Heart disease in her maternal grandmother; Heart failure in her mother; Stroke in her maternal uncle.  ROS:   Please see the history of present illness.    (+) Twinges of chest pain (+) LE edema (+) Muscle cramps (+) Sharp frontal headaches (+) Dyspnea All other systems reviewed and are negative.  EKGs/Labs/Other Studies Reviewed:    The following studies were reviewed today:  MRI/MRA of head/brain   10/21/2021   ([facility]): IMPRESSION:  1. 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 This could  represent a cyst or an adenoma. Laboratory analysis could be helpful for  correlation. Follow-up should include an MRI pituitary protocol with and  without contrast.   2. Normal MRA of the brain.   Echocardiogram  10/25/2019  ([facility]): Left Ventricle: Systolic function is normal. EF: 60-65%.    Left Ventricle: Wall thickness is  normal.    Left Ventricle: No regional wall motion abnormalities noted.    Left Ventricle: Doppler parameters indicate normal diastolic function.    Left Atrium: Left atrium is normal in size.    Right Ventricle: Right ventricle appears normal.    Right Ventricle: Normal tricuspid annular plane systolic excursion  (TAPSE) >1.7 cm.   EKG:   EKG is personally reviewed. 05/27/2022: EKG was not ordered.  Recent Labs: 04/11/2022: BUN 10; Creatinine, Ser 0.89; Hemoglobin 12.7; Platelets 359; Potassium 4.1; Sodium 137   Recent Lipid Panel    Component Value Date/Time   CHOL 164 01/09/2009 2232   TRIG 80 01/09/2009 2232   HDL 60 01/09/2009 2232   CHOLHDL 2.7 Ratio 01/09/2009 2232   VLDL 16 01/09/2009 2232   LDLCALC 88 01/09/2009 2232   Physical Exam:  Wt Readings from Last 3 Encounters:  05/27/22 289 lb 1.6 oz (131.1 kg)  04/11/22 279 lb 15.8 oz (127 kg)  03/29/22 279 lb 15.8 oz (127 kg)     VS:  BP 126/78 (BP Location: Right Arm, Patient Position: Sitting, Cuff Size: Large)   Pulse 77   Ht 5\' 1"  (1.549 m)   Wt 289 lb 1.6 oz (131.1 kg)   SpO2 98%   BMI 54.62 kg/m  , BMI Body mass index is 54.62 kg/m. GENERAL:  Well appearing HEENT: Pupils equal round and reactive, fundi not visualized, oral mucosa unremarkable NECK:  No jugular venous distention, waveform within normal limits, carotid upstroke brisk and symmetric, no bruits, no thyromegaly LYMPHATICS:  No cervical adenopathy LUNGS:  Clear to auscultation bilaterally HEART:  RRR.  PMI not displaced or sustained,S1 and S2 within normal limits, no S3, no S4, no clicks, no rubs, no murmurs ABD:  Flat, positive bowel sounds normal in frequency in pitch, no bruits, no rebound, no guarding, no midline pulsatile mass, no hepatomegaly, no splenomegaly EXT:  2 plus pulses throughout, no edema, no cyanosis no clubbing SKIN:  No rashes no nodules NEURO:  Cranial nerves II through XII grossly intact, motor grossly intact  throughout PSYCH:  Cognitively intact, oriented to person place and time   ASSESSMENT:    1. Chest pain of uncertain etiology   2. Essential hypertension   3. Morbid obesity (HCC)    PLAN:    Chest pain of uncertain etiology Ms. Rosner has episodes of chest pain that are non-exertional.  She does exercise regularly and has no exertional symptoms.  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  I suspect this is more related to musculoskeletal strain, though she has no chest wall tenderness on exam today.  It could also be radicular pain.  I do not think any ischemia evaluation is indicated at this time.  We will get a coronary calcium score to better understand her overall risk and lipid goals.  Essential hypertension Blood pressure is well-controlled on losartan/HCTZ.  OSA on CPAP Continue CPAP therapy.  Morbid obesity (HCC) Keep working on diet and exercise.  Continue Ozempic.        Disposition: FU with Aviendha Azbell [facility] Salvia, MD, [facility] in 6 months.  Medication Adjustments/Labs and Tests Ordered: Current medicines are reviewed at length with the patient today.  Concerns regarding medicines are outlined above.   Orders Placed This Encounter  Procedures   CT CARDIAC SCORING (SELF PAY ONLY)   No orders of the defined types were placed in this encounter.  Patient Instructions  Medication Instructions:  Your physician recommends that you continue on your current medications as directed. Please refer to the Current Medication list given to you today.   *If you need a refill on your cardiac medications before your next appointment, please call your pharmacy*  Lab Work: NONE  Testing/Procedures: CALCIUM SCOER - THIS WILL COST [employer]Snow OUT OF POCKET   Follow-Up: At [facility], you and your health needs are our priority.  As part of our continuing mission to provide you with exceptional heart care, we have created designated Provider Care  Teams.  These Care Teams include your primary Cardiologist (physician) and Advanced Practice Providers (APPs -  Physician Assistants and Nurse Practitioners) who all work together to provide you with the care you need, when you need it.  We recommend signing up for the patient portal called "MyChart".  Sign up information is provided on this After Visit Summary.  MyChart is used to connect with patients for Virtual Visits (Telemedicine).  Patients are able to view lab/test results, encounter notes, upcoming appointments, etc.  Non-urgent messages can be sent to your provider as well.   To learn more about what you can do with MyChart, go to [email or web address].    Your next appointment:   6 month(s)  Provider:   Chilton Si, MD      [facility],acting as a scribe for Chilton Si, MD.,have documented all relevant documentation on the behalf of Chilton Si, MD,as directed by  Chilton Si, MD while in the presence of Chilton Si, MD.  I, Amberlie Gaillard [facility] Salvia, MD have reviewed all documentation for this visit.  The documentation of the exam, diagnosis, procedures, and orders on 05/27/2022 are all accurate and complete.   Signed, Chilton Si, MD  05/27/2022 1:58 PM    [facility]

## 2022-05-27 NOTE — Patient Instructions (Signed)
Medication Instructions:  Your physician recommends that you continue on your current medications as directed. Please refer to the Current Medication list given to you today.   *If you need a refill on your cardiac medications before your next appointment, please call your pharmacy*  Lab Work: NONE  Testing/Procedures: CALCIUM SCOER - THIS WILL COST [email or web address] OUT OF POCKET   Follow-Up: At [facility], you and your health needs are our priority.  As part of our continuing mission to provide you with exceptional heart care, we have created designated Provider Care Teams.  These Care Teams include your primary Cardiologist (physician) and Advanced Practice Providers (APPs -  Physician Assistants and Nurse Practitioners) who all work together to provide you with the care you need, when you need it.  We recommend signing up for the patient portal called "MyChart".  Sign up information is provided on this After Visit Summary.  MyChart is used to connect with patients for Virtual Visits (Telemedicine).  Patients are able to view lab/test results, encounter notes, upcoming appointments, etc.  Non-urgent messages can be sent to your provider as well.   To learn more about what you can do with MyChart, go to [email or web address].    Your next appointment:   6 month(s)  Provider:   Chilton Si, MD

## 2022-07-03 ENCOUNTER — Inpatient Hospital Stay (HOSPITAL_BASED_OUTPATIENT_CLINIC_OR_DEPARTMENT_OTHER): Admission: RE | Admit: 2022-07-03 | Payer: Managed Care, Other (non HMO) | Source: Ambulatory Visit

## 2022-07-22 ENCOUNTER — Ambulatory Visit (HOSPITAL_BASED_OUTPATIENT_CLINIC_OR_DEPARTMENT_OTHER)
Admission: RE | Admit: 2022-07-22 | Discharge: 2022-07-22 | Disposition: A | Discharge status: Home / Self Care | Payer: Managed Care, Other (non HMO) | Source: Ambulatory Visit | Attending: Cardiovascular Disease | Admitting: Cardiovascular Disease

## 2022-07-22 DIAGNOSIS — R079 Chest pain, unspecified: Secondary | ICD-10-CM | POA: Insufficient documentation

## 2022-07-22 DIAGNOSIS — I10 Essential (primary) hypertension: Secondary | ICD-10-CM | POA: Insufficient documentation

## 2022-07-29 ENCOUNTER — Telehealth (HOSPITAL_BASED_OUTPATIENT_CLINIC_OR_DEPARTMENT_OTHER): Payer: Self-pay

## 2022-07-29 NOTE — Telephone Encounter (Addendum)
Left message for patient to call back    ----- Message from Alver Sorrow, NP sent at 07/29/2022  5:06 PM EDT ----- Coronary calcium score of 0.  No evidence of heart disease.  Good result!

## 2022-07-30 NOTE — Telephone Encounter (Signed)
Patient is returning call. Requesting return call.  

## 2022-07-30 NOTE — Telephone Encounter (Signed)
Returned call to patient, results reviewed with patient. Patient verbalizes understanding.

## 2022-11-29 ENCOUNTER — Ambulatory Visit (HOSPITAL_BASED_OUTPATIENT_CLINIC_OR_DEPARTMENT_OTHER): Payer: Managed Care, Other (non HMO) | Admitting: Cardiovascular Disease

## 2022-11-29 ENCOUNTER — Encounter (HOSPITAL_BASED_OUTPATIENT_CLINIC_OR_DEPARTMENT_OTHER): Payer: Self-pay | Admitting: Cardiovascular Disease

## 2022-11-29 VITALS — BP 121/81 | HR 85 | Ht 61.0 in | Wt 279.0 lb

## 2022-11-29 DIAGNOSIS — I10 Essential (primary) hypertension: Secondary | ICD-10-CM

## 2022-11-29 MED ORDER — LOSARTAN POTASSIUM-HCTZ 50-12.5 MG PO TABS: 0.5000 | ORAL_TABLET | Freq: Every day | ORAL | 3 refills | Status: DC
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
## 2022-11-29 NOTE — Patient Instructions (Signed)
Medication Instructions:  Your physician has recommended you make the following change in your medication:   Take a half tablet of your losartan- hydrochlorothiazide  Follow-Up: At [facility], you and your health needs are our priority.  As part of our continuing mission to provide you with exceptional heart care, we have created designated Provider Care Teams.  These Care Teams include your primary Cardiologist (physician) and Advanced Practice Providers (APPs -  Physician Assistants and Nurse Practitioners) who all work together to provide you with the care you need, when you need it.  We recommend signing up for the patient portal called "MyChart".  Sign up information is provided on this After Visit Summary.  MyChart is used to connect with patients for Virtual Visits (Telemedicine).  Patients are able to view lab/test results, encounter notes, upcoming appointments, etc.  Non-urgent messages can be sent to your provider as well.   To learn more about what you can do with MyChart, go to [email or web address].    Your next appointment:   One month with pharmd   &   1 year with Dr. [facility] Salvia   Other Instructions Tips to Measure your Blood Pressure Correctly  To determine whether you have hypertension, a medical professional will take a blood pressure reading. How you prepare for the test, the position of your arm, and other factors can change a blood pressure reading by 10% or more. That could be enough to hide high blood pressure, start you on a drug you don't really need, or lead your doctor to incorrectly adjust your medications.  National and international guidelines offer specific instructions for measuring blood pressure. If a doctor, nurse, or medical assistant isn't doing it right, don't hesitate to ask him or her to get with the guidelines.  Here's what you can do to ensure a correct reading:  Don't drink a caffeinated beverage or smoke during the 30 minutes before  the test.  Sit quietly for five minutes before the test begins.  During the measurement, sit in a chair with your feet on the floor and your arm supported so your elbow is at about heart level.  The inflatable part of the cuff should completely cover at least 80% of your upper arm, and the cuff should be placed on bare skin, not over a shirt.  Don't talk during the measurement.  Have your blood pressure measured twice, with a brief break in between. If the readings are different by 5 points or more, have it done a third time.  In 2017, new guidelines from the American Heart Association, the [employer] of Cardiology, and nine other health organizations lowered the diagnosis of high blood pressure to 130/80 mm Hg or higher for all adults. The guidelines also redefined the various blood pressure categories to now include normal, elevated, Stage 1 hypertension, Stage 2 hypertension, and hypertensive crisis (see "Blood pressure categories").  Blood pressure categories  Blood pressure category SYSTOLIC (upper number)  DIASTOLIC (lower number)  Normal Less than 120 mm Hg and Less than 80 mm Hg  Elevated 120-129 mm Hg and Less than 80 mm Hg  High blood pressure: Stage 1 hypertension 130-139 mm Hg or 80-89 mm Hg  High blood pressure: Stage 2 hypertension 140 mm Hg or higher or 90 mm Hg or higher  Hypertensive crisis (consult your doctor immediately) Higher than 180 mm Hg and/or Higher than 120 mm Hg  Source: American Heart Association and American Stroke Association. For more on getting your blood  pressure under control, buy Controlling Your Blood Pressure, a Special Health Report from [facility].   Blood Pressure Log   Date   Time  Blood Pressure  Position  Example: Nov 1 9 AM 124/78 sitting

## 2022-12-17 ENCOUNTER — Ambulatory Visit
Admission: EM | Admit: 2022-12-17 | Discharge: 2022-12-17 | Disposition: A | Discharge status: Home / Self Care | Payer: Managed Care, Other (non HMO) | Attending: Internal Medicine | Admitting: Internal Medicine

## 2022-12-17 DIAGNOSIS — I10 Essential (primary) hypertension: Secondary | ICD-10-CM | POA: Diagnosis not present

## 2022-12-17 NOTE — Discharge Instructions (Signed)
For diabetes or elevated blood sugar, please make sure you are limiting and avoiding starchy, carbohydrate foods like pasta, breads, sweet breads, pastry, rice, potatoes, desserts. These foods can elevate your blood sugar. Also, limit and avoid drinks that contain a lot of sugar such as sodas, sweet teas, fruit juices.  Drinking plain water will be much more helpful, try 64 ounces of water daily.  It is okay to flavor your water naturally by cutting cucumber, lemon, mint or lime, placing it in a picture with water and drinking it over a period of 24-48 hours as long as it remains refrigerated. ? ?For elevated blood pressure, make sure you are monitoring salt in your diet.  Do not eat restaurant foods and limit processed foods at home. I highly recommend you prepare and cook your own foods at home.  Processed foods include things like frozen meals, pre-seasoned meats and dinners, deli meats, canned foods as these foods contain a high amount of sodium/salt.  Make sure you are paying attention to sodium labels on foods you buy at the grocery store. Buy your spices separately such as garlic powder, onion powder, cumin, cayenne, parsley flakes so that you can avoid seasonings that contain salt. However, salt-free seasonings are available and can be used, an example is Mrs. Dash and includes a lot of different mixtures that do not contain salt. ? ?Lastly, when cooking using oils that are healthier for you is important. This includes olive oil, avocado oil, canola oil. We have discussed a lot of foods to avoid but below is a list of foods that can be very healthy to use in your diet whether it is for diabetes, cholesterol, high blood pressure, or in general healthy eating. ? ?Salads - kale, spinach, cabbage, spring mix, arugula ?Fruits - avocadoes, berries (blueberries, raspberries, blackberries), apples, oranges, pomegranate, grapefruit, kiwi ?Vegetables - asparagus, cauliflower, broccoli, green beans, brussel sprouts,  bell peppers, beets; stay away from or limit starchy vegetables like potatoes, carrots, peas ?Other general foods - kidney beans, egg whites, almonds, walnuts, sunflower seeds, pumpkin seeds, fat free yogurt, almond milk, flax seeds, quinoa, oats  ?Meat - It is better to eat lean meats and limit your red meat including pork to once a week.  Wild caught fish, chicken breast are good options as they tend to be leaner sources of good protein. Still be mindful of the sodium labels for the meats you buy. ? ?DO NOT EAT ANY FOODS ON THIS LIST THAT YOU ARE ALLERGIC TO. For more specific needs, I highly recommend consulting a dietician or nutritionist but this can definitely be a good starting point. ? ?

## 2022-12-17 NOTE — ED Provider Notes (Signed)
[facility]  Note:  This document was prepared using Conservation officer, historic buildings and may include unintentional dictation errors.  MRN: [id number] DOB: 21-Feb-1965  Subjective:   Haley Weber is a 57 y.o. female presenting for a blood pressure check.  Patient reports that she has been getting manage for her blood pressure with losartan hydrochlorothiazide.  She recently started this medication and has been getting it titrated by her PCP.  She has concerns that her blood pressure has either been really low or very high.  Denies any active headache, confusion, weakness, numbness or tingling, chest pain, abdominal pain.  No history of stroke or heart disease, MI.  No current facility-administered medications for this encounter.  Current Outpatient Medications:    acetaminophen (TYLENOL) 325 MG tablet, Take 325 mg by mouth every 6 (six) hours as needed (cramping)., Disp: , Rfl:    ibuprofen (ADVIL,MOTRIN) 200 MG tablet, Take 3 tablets (600 mg total) by mouth every 6 (six) hours as needed for cramping., Disp: 30 tablet, Rfl: 0   losartan-hydrochlorothiazide (HYZAAR) 50-12.5 MG tablet, Take 0.5 tablets by mouth daily., Disp: 45 tablet, Rfl: 3   Multiple Vitamins-Minerals (MULTIVITAMIN PO), Take 1 tablet by mouth daily., Disp: , Rfl:    OZEMPIC, 0.25 OR 0.5 MG/DOSE, 2 MG/3ML SOPN, Inject 0.25 mg into the skin once a week., Disp: , Rfl:    Allergies  Allergen Reactions   Bactrim [Sulfamethoxazole-Trimethoprim] Hives   Iodinated Contrast Media Nausea And Vomiting    Past Medical History:  Diagnosis Date   Anemia    Anxiety    no meds   Asthma    rarely used inhaler   Chest pain of uncertain etiology 05/27/2022   Essential hypertension 05/27/2022   History of blood transfusion 02/25/2012   At [facility], 2 units transfused   Morbid obesity (HCC) 05/27/2022   OSA on CPAP 05/27/2022   SVD (spontaneous vaginal delivery)    x 2     Past Surgical  History:  Procedure Laterality Date   COLONOSCOPY WITH PROPOFOL N/A 03/29/2022   Procedure: COLONOSCOPY WITH PROPOFOL;  Surgeon: Jeani Hawking, MD;  Location: [facility];  Service: Gastroenterology;  Laterality: N/A;   DILITATION & CURRETTAGE/HYSTROSCOPY WITH VERSAPOINT RESECTION N/A 06/30/2012   Procedure: DILATATION & CURETTAGE/HYSTEROSCOPY WITH thermachoice;  Surgeon: [name], MD;  Location: [facility];  Service: Gynecology;  Laterality: N/A;   left acl repair     TUBAL LIGATION      Family History  Problem Relation Age of Onset   Heart failure Mother    Diabetes Father    Stroke Maternal Uncle    Heart disease Maternal Grandmother     Social History   Tobacco Use   Smoking status: Never   Smokeless tobacco: Never  Substance Use Topics   Alcohol use: Not Currently   Drug use: Never    ROS   Objective:   Vitals: BP 136/84 (BP Location: Right Arm)   Pulse 95   Temp 97.8 F (36.6 C) (Oral)   Resp 20   SpO2 97%   Physical Exam Constitutional:      General: She is not in acute distress.    Appearance: Normal appearance. She is well-developed. She is not ill-appearing, toxic-appearing or diaphoretic.  HENT:     Head: Normocephalic and atraumatic.     Nose: Nose normal.     Mouth/Throat:     Mouth: Mucous membranes are moist.  Eyes:  General: No scleral icterus.       Right eye: No discharge.        Left eye: No discharge.     Extraocular Movements: Extraocular movements intact.  Neck:     Vascular: No carotid bruit.  Cardiovascular:     Rate and Rhythm: Normal rate and regular rhythm.     Heart sounds: Normal heart sounds. No murmur heard.    No friction rub. No gallop.  Pulmonary:     Effort: Pulmonary effort is normal. No respiratory distress.     Breath sounds: No stridor. No wheezing, rhonchi or rales.  Chest:     Chest wall: No tenderness.  Skin:    General: Skin is warm and dry.  Neurological:     General: No focal deficit present.      Mental Status: She is alert and oriented to person, place, and time.     Cranial Nerves: No cranial nerve deficit.     Motor: No weakness.     Coordination: Coordination normal.     Gait: Gait normal.     Comments: No facial asymmetry.  Negative Romberg and pronator drift.  Psychiatric:        Mood and Affect: Mood normal.        Behavior: Behavior normal.     Assessment and Plan :   PDMP not reviewed this encounter.  1. Essential hypertension    Will avoid any changes to her current regimen.  Recommended hypertensive friendly diet.  Monitor for hypertensive urgency/emergency symptoms.  Follow-up closely with her PCP.  Counseled patient on potential for adverse effects with medications prescribed/recommended today, ER and return-to-clinic precautions discussed, patient verbalized understanding.    Wallis Bamberg, PA-C 12/17/22 1724

## 2022-12-17 NOTE — ED Triage Notes (Addendum)
Pt states she was started on low dose BP med ~1.5 weeks ago-med was causing hypotension-MD reduced med-today ~9am she had a "hot flash and my blood pressure shot up-161/77"-denies pain with event-NAD-steady gait

## 2022-12-29 ENCOUNTER — Encounter (HOSPITAL_BASED_OUTPATIENT_CLINIC_OR_DEPARTMENT_OTHER): Payer: Self-pay | Admitting: Cardiovascular Disease

## 2023-05-13 ENCOUNTER — Encounter (HOSPITAL_BASED_OUTPATIENT_CLINIC_OR_DEPARTMENT_OTHER): Payer: Self-pay

## 2023-05-13 ENCOUNTER — Other Ambulatory Visit: Payer: Self-pay

## 2023-05-13 ENCOUNTER — Emergency Department (HOSPITAL_BASED_OUTPATIENT_CLINIC_OR_DEPARTMENT_OTHER)

## 2023-05-13 ENCOUNTER — Emergency Department (HOSPITAL_BASED_OUTPATIENT_CLINIC_OR_DEPARTMENT_OTHER)
Admission: EM | Admit: 2023-05-13 | Discharge: 2023-05-13 | Disposition: A | Discharge status: Home / Self Care | Attending: Emergency Medicine | Admitting: Emergency Medicine

## 2023-05-13 DIAGNOSIS — J45909 Unspecified asthma, uncomplicated: Secondary | ICD-10-CM | POA: Diagnosis not present

## 2023-05-13 DIAGNOSIS — I10 Essential (primary) hypertension: Secondary | ICD-10-CM | POA: Insufficient documentation

## 2023-05-13 DIAGNOSIS — Z79899 Other long term (current) drug therapy: Secondary | ICD-10-CM | POA: Diagnosis not present

## 2023-05-13 DIAGNOSIS — R55 Syncope and collapse: Secondary | ICD-10-CM | POA: Diagnosis not present

## 2023-05-13 DIAGNOSIS — R002 Palpitations: Secondary | ICD-10-CM | POA: Diagnosis present

## 2023-05-13 LAB — D-DIMER, QUANTITATIVE: D-Dimer, Quant: 0.55 ug{FEU}/mL — ABNORMAL HIGH (ref 0.00–0.50)

## 2023-05-13 LAB — BASIC METABOLIC PANEL WITH GFR
Anion gap: 10 (ref 5–15)
BUN: 8 mg/dL (ref 6–20)
CO2: 25 mmol/L (ref 22–32)
Calcium: 9.4 mg/dL (ref 8.9–10.3)
Chloride: 103 mmol/L (ref 98–111)
Creatinine, Ser: 0.79 mg/dL (ref 0.44–1.00)
GFR, Estimated: >60 mL/min (ref >60)
Glucose, Bld: 103 mg/dL — ABNORMAL HIGH (ref 70–99)
Potassium: 3.5 mmol/L (ref 3.5–5.1)
Sodium: 138 mmol/L (ref 135–145)

## 2023-05-13 LAB — CBC
HCT: 43.4 % (ref 36.0–46.0)
Hemoglobin: 13.5 g/dL (ref 12.0–15.0)
MCH: 25.8 pg — ABNORMAL LOW (ref 26.0–34.0)
MCHC: 31.1 g/dL (ref 30.0–36.0)
MCV: 83 fL (ref 80.0–100.0)
Platelets: 300 10*3/uL (ref 150–400)
RBC: 5.23 MIL/uL — ABNORMAL HIGH (ref 3.87–5.11)
RDW: 14.2 % (ref 11.5–15.5)
WBC: 7.7 10*3/uL (ref 4.0–10.5)
nRBC: 0 % (ref 0.0–0.2)

## 2023-05-13 LAB — TROPONIN I (HIGH SENSITIVITY): Troponin I (High Sensitivity): 4 ng/L (ref <18)

## 2023-05-13 LAB — BRAIN NATRIURETIC PEPTIDE: B Natriuretic Peptide: 46.8 pg/mL (ref 0.0–100.0)

## 2023-05-13 NOTE — ED Provider Notes (Signed)
 [place] EMERGENCY DEPARTMENT AT [facility] Provider Note   CSN: [id number] Arrival date & time: 05/13/23  9528     History  Chief Complaint  Patient presents with   Palpitations    Haley Weber is a 58 y.o. female.   Palpitations    58 year old female with medical history significant for anxiety, asthma, anemia with history of blood transfusion, HTN, OSA on CPAP, morbid obesity presenting to the emergency department with an episode of heart palpitations, lightheadedness and near syncope.  The patient states that earlier this evening she woke up with her heart racing with palpitations and a metallic taste in her mouth.  She takes losartan at home for hypertension.  She states that she was feeling lightheaded as if she was going to pass out and noticed that her lips and face were pale.  She went to the shower and almost passed out but did not.  Symptoms have since resolved.  She denied any chest pain, she denies any shortness of breath.  She did endorses very mild trace lower extremity swelling bilaterally.  No history of DVT or PE.  She is not on anticoagulation.  No cough, fevers or chills.  She is currently asymptomatic.  Home Medications Prior to Admission medications   Medication Sig Start Date End Date Taking? Authorizing Provider  acetaminophen (TYLENOL) 325 MG tablet Take 325 mg by mouth every 6 (six) hours as needed (cramping).    [provider]  ibuprofen (ADVIL,MOTRIN) 200 MG tablet Take 3 tablets (600 mg total) by mouth every 6 (six) hours as needed for cramping. 08/31/17   Katrinka Blazing, [place], CNM  losartan-hydrochlorothiazide (HYZAAR) 50-12.5 MG tablet Take 0.5 tablets by mouth daily. 11/29/22   Chilton Si, MD  Multiple Vitamins-Minerals (MULTIVITAMIN PO) Take 1 tablet by mouth daily.    [provider]  OZEMPIC, 0.25 OR 0.5 MG/DOSE, 2 MG/3ML SOPN Inject 0.25 mg into the skin once a week.    [provider]      Allergies     Bactrim [sulfamethoxazole-trimethoprim] and Iodinated contrast media    Review of Systems   Review of Systems  Cardiovascular:  Positive for palpitations.  Neurological:  Positive for light-headedness.  All other systems reviewed and are negative.   Physical Exam Updated Vital Signs BP (!) 159/87 (BP Location: Right Arm)   Pulse 64   Temp 97.8 F (36.6 C) (Oral)   Resp 18   Ht 5\' 1"  (1.549 m)   Wt 127.9 kg   SpO2 100%   BMI 53.28 kg/m  Physical Exam Vitals and nursing note reviewed.  Constitutional:      General: She is not in acute distress.    Appearance: She is well-developed. She is obese.  HENT:     Head: Normocephalic and atraumatic.  Eyes:     Conjunctiva/sclera: Conjunctivae normal.  Cardiovascular:     Rate and Rhythm: Normal rate and regular rhythm.     Pulses: Normal pulses.     Heart sounds: No murmur heard. Pulmonary:     Effort: Pulmonary effort is normal. No respiratory distress.     Breath sounds: Normal breath sounds.  Abdominal:     General: There is no distension.     Palpations: Abdomen is soft.  Musculoskeletal:        General: No swelling.     Cervical back: Neck supple.     Right lower leg: No edema.     Left lower leg: No edema.  Skin:  General: Skin is warm and dry.     Capillary Refill: Capillary refill takes less than 2 seconds.  Neurological:     General: No focal deficit present.     Mental Status: She is alert and oriented to person, place, and time.  Psychiatric:        Mood and Affect: Mood normal.     ED Results / Procedures / Treatments   Labs (all labs ordered are listed, but only abnormal results are displayed) Labs Reviewed  BASIC METABOLIC PANEL WITH GFR - Abnormal; Notable for the following components:      Result Value   Glucose, Bld 103 (*)    All other components within normal limits  CBC - Abnormal; Notable for the following components:   RBC 5.23 (*)    MCH 25.8 (*)    All other components within normal  limits  D-DIMER, QUANTITATIVE - Abnormal; Notable for the following components:   D-Dimer, Quant 0.55 (*)    All other components within normal limits  BRAIN NATRIURETIC PEPTIDE  TROPONIN I (HIGH SENSITIVITY)    EKG EKG Interpretation Date/Time:  Tuesday May 13 2023 08:14:29 EDT Ventricular Rate:  66 PR Interval:  156 QRS Duration:  84 QT Interval:  376 QTC Calculation: 394 R Axis:   54  Text Interpretation: Sinus rhythm Confirmed by Ernie Avena ([id number]) on 05/13/2023 9:09:04 AM  Radiology DG Chest Port 1 View Result Date: 05/13/2023 CLINICAL DATA:  Atrial fibrillation EXAM: PORTABLE CHEST 1 VIEW COMPARISON:  04/11/2022 FINDINGS: Normal heart size and mediastinal contours. No acute infiltrate or edema. No effusion or pneumothorax. No acute osseous findings. IMPRESSION: No active disease. Electronically Signed   By: Tiburcio Pea M.D.   On: 05/13/2023 07:25    Procedures Procedures    Medications Ordered in ED Medications - No data to display  ED Course/ Medical Decision Making/ A&P Clinical Course as of 05/13/23 1125  Tue May 13, 2023  1123 D-Dimer, Quant(!): 0.55 Negative age adjusted dimer [JL]    Clinical Course User Index [JL] Ernie Avena, MD                                 Medical Decision Making Amount and/or Complexity of Data Reviewed Labs: ordered. Decision-making details documented in ED Course. Radiology: ordered.     58 year old female with medical history significant for anxiety, asthma, anemia with history of blood transfusion, HTN, OSA on CPAP, morbid obesity presenting to the emergency department with an episode of heart palpitations, lightheadedness and near syncope.  The patient states that earlier this evening she woke up with her heart racing with palpitations and a metallic taste in her mouth.  She takes losartan at home for hypertension.  She states that she was feeling lightheaded as if she was going to pass out and noticed that her lips  and face were pale.  She went to the shower and almost passed out but did not.  Symptoms have since resolved.  She denied any chest pain, she denies any shortness of breath.  She did endorses very mild trace lower extremity swelling bilaterally.  No history of DVT or PE.  She is not on anticoagulation.  No cough, fevers or chills.  She is currently asymptomatic.  Medical Decision Making:   ANALUCIA HUSH is a 58 y.o. female who presented to the ED today with a near syncopal episode detailed above.  Complete initial physical exam performed, notably the patient  was lungs CTAB.    Reviewed and confirmed nursing documentation for past medical history, family history, social history.    Initial Assessment:   With the patient's presentation of syncope, most likely diagnosis is orthostatic hypotension vs vasovagal episode. Other diagnoses were considered including (but not limited to) arrythmogenic syncope, valvular abnormality, PE, aortic dissection. These are considered less likely due to history of present illness and physical exam findings.   This is most consistent with an acute life/limb threatening illness complicated by underlying chronic conditions. In particular, concerning cardiac etiology, this is less likely to be the etiology given the lack of chest pain, lack of serious comorbidities including heart failure or CAD.   Additionally, patient's history appears more consistent with benign episodes including orthostatic hypotension or vagal episode based on her HPI.  FAINT score can be utilized in syncope to predict cardiac event in the appropriate patient. Utilization of BNP/Troponin testing can predict cardiac events within 30 days.   This patient is a candidate for a faint score as she has a baseline low pretest probability for cardiac etiology. Will proceed with BNP/Troponin testing as below. If negative, then patient without risk factor for acute cardiac event causing her syncope. Pt is  PERC positive, Wells low risk.   Initial Plan:  Screening labs including CBC and Metabolic panel to evaluate for infectious or metabolic etiology of disease.  Dimer to rule out PE Troponin and BNP as above CXR to evaluate for structural/infectious intrathoracic pathology.  EKG to evaluate for cardiac pathology. Utilization of FAINT scoring detailed above.  Objective evaluation as below reviewed after administration of IVF/Telemetry monitoring  Initial Study Results:   Laboratory  All laboratory results reviewed without evidence of clinically relevant pathology.   Exceptions include: None, dimer negative after adjustment for age, BNP and troponin normal, BMP without electrolyte abnormality, normal renal function, CBC without a leukocytosis or anemia  EKG EKG was reviewed independently. Rate, rhythm, axis, intervals all examined and without medically relevant abnormality. ST segments without concerns for elevations.    Radiology:  All images reviewed independently. Agree with radiology report at this time.   DG Chest Port 1 View Result Date: 05/13/2023 CLINICAL DATA:  Atrial fibrillation EXAM: PORTABLE CHEST 1 VIEW COMPARISON:  04/11/2022 FINDINGS: Normal heart size and mediastinal contours. No acute infiltrate or edema. No effusion or pneumothorax. No acute osseous findings. IMPRESSION: No active disease. Electronically Signed   By: Tiburcio Pea M.D.   On: 05/13/2023 07:25         Final Assessment and Plan:   Patient with near syncopal episode, suspect likely vasovagal episode.  Reassuring EKG, chest x-ray and laboratory evaluation.  Orthostatics negative.  Faint score negative, patient low risk, stable for outpatient follow-up, return precautions provided.   Clinical Impression:  1. Near syncope   2. Palpitations   3. Vasovagal episode      Discharge          Final Clinical Impression(s) / ED Diagnoses Final diagnoses:  Near syncope  Palpitations  Vasovagal  episode    Rx / DC Orders ED Discharge Orders     None         Ernie Avena, MD 05/13/23 1125

## 2023-05-13 NOTE — ED Triage Notes (Signed)
 Patient states she woke up with her heart racing and metallic taste in her mouth. Patient takes losartan at home and took some about an hour ago. Also reports shaking. Denies blood thinners. Denies chest pain. Some shortness of breath.

## 2023-05-13 NOTE — Discharge Instructions (Addendum)
 Your EKG, chest x-ray and laboratory evaluation was overall reassuring.  Your symptoms are consistent with likely vasovagal near syncope.  Please follow-up with your PCP, return to the emergency department for any worsening symptoms or recurrence

## 2023-12-09 ENCOUNTER — Other Ambulatory Visit (HOSPITAL_BASED_OUTPATIENT_CLINIC_OR_DEPARTMENT_OTHER): Payer: Self-pay | Admitting: Cardiovascular Disease

## 2024-01-12 ENCOUNTER — Other Ambulatory Visit (HOSPITAL_BASED_OUTPATIENT_CLINIC_OR_DEPARTMENT_OTHER): Payer: Self-pay | Admitting: Cardiovascular Disease
